# Patient Record
Sex: Male | Born: 1965 | Race: Black or African American | Hispanic: No | Marital: Single | Smoking: Former smoker
Health system: Southern US, Community
[De-identification: ages and names within clinical notes are randomized; demographics above are authoritative.]

## PROBLEM LIST (undated history)

## (undated) DIAGNOSIS — R569 Unspecified convulsions: Secondary | ICD-10-CM

---

## 2017-03-01 ENCOUNTER — Emergency Department (HOSPITAL_COMMUNITY): Payer: TRICARE For Life (TFL)

## 2017-03-01 ENCOUNTER — Emergency Department (HOSPITAL_COMMUNITY)
Admission: EM | Admit: 2017-03-01 | Discharge: 2017-03-01 | Disposition: A | Discharge status: Home / Self Care | Payer: TRICARE For Life (TFL) | Attending: Emergency Medicine | Admitting: Emergency Medicine

## 2017-03-01 DIAGNOSIS — R569 Unspecified convulsions: Secondary | ICD-10-CM | POA: Insufficient documentation

## 2017-03-01 DIAGNOSIS — T50905A Adverse effect of unspecified drugs, medicaments and biological substances, initial encounter: Secondary | ICD-10-CM | POA: Diagnosis not present

## 2017-03-01 LAB — BASIC METABOLIC PANEL
ANION GAP: 8 (ref 5–15)
BUN: 6 mg/dL (ref 6–20)
CALCIUM: 9.3 mg/dL (ref 8.9–10.3)
CHLORIDE: 103 mmol/L (ref 101–111)
CO2: 26 mmol/L (ref 22–32)
CREATININE: 1.37 mg/dL — AB (ref 0.61–1.24)
GFR calc non Af Amer: 59 mL/min — ABNORMAL LOW (ref >60)
Glucose, Bld: 125 mg/dL — ABNORMAL HIGH (ref 65–99)
Potassium: 4.1 mmol/L (ref 3.5–5.1)
SODIUM: 137 mmol/L (ref 135–145)

## 2017-03-01 LAB — URINALYSIS, ROUTINE W REFLEX MICROSCOPIC
BILIRUBIN URINE: NEGATIVE
GLUCOSE, UA: NEGATIVE mg/dL
HGB URINE DIPSTICK: NEGATIVE
KETONES UR: NEGATIVE mg/dL
Leukocytes, UA: NEGATIVE
Nitrite: NEGATIVE
Protein, ur: NEGATIVE mg/dL
Specific Gravity, Urine: 1.012 (ref 1.005–1.030)
pH: 8 (ref 5.0–8.0)

## 2017-03-01 LAB — CBC
HCT: 42.9 % (ref 39.0–52.0)
HEMOGLOBIN: 14.8 g/dL (ref 13.0–17.0)
MCH: 30.2 pg (ref 26.0–34.0)
MCHC: 34.5 g/dL (ref 30.0–36.0)
MCV: 87.6 fL (ref 78.0–100.0)
PLATELETS: 157 10*3/uL (ref 150–400)
RBC: 4.9 MIL/uL (ref 4.22–5.81)
RDW: 12.6 % (ref 11.5–15.5)
WBC: 12 10*3/uL — AB (ref 4.0–10.5)

## 2017-03-01 LAB — HEPATIC FUNCTION PANEL
ALBUMIN: 3.9 g/dL (ref 3.5–5.0)
ALT: 37 U/L (ref 17–63)
AST: 29 U/L (ref 15–41)
Alkaline Phosphatase: 71 U/L (ref 38–126)
TOTAL PROTEIN: 6.7 g/dL (ref 6.5–8.1)
Total Bilirubin: 0.4 mg/dL (ref 0.3–1.2)

## 2017-03-01 LAB — I-STAT TROPONIN, ED: TROPONIN I, POC: 0 ng/mL (ref 0.00–0.08)

## 2017-03-01 LAB — I-STAT CG4 LACTIC ACID, ED
LACTIC ACID, VENOUS: 1.3 mmol/L (ref 0.5–1.9)
Lactic Acid, Venous: 3.38 mmol/L (ref 0.5–1.9)

## 2017-03-01 LAB — CBG MONITORING, ED: Glucose-Capillary: 118 mg/dL — ABNORMAL HIGH (ref 65–99)

## 2017-03-01 LAB — RAPID URINE DRUG SCREEN, HOSP PERFORMED
Amphetamines: NOT DETECTED
Barbiturates: NOT DETECTED
Benzodiazepines: NOT DETECTED
Cocaine: NOT DETECTED
Opiates: NOT DETECTED
Tetrahydrocannabinol: POSITIVE — AB

## 2017-03-01 LAB — LIPASE, BLOOD: Lipase: 19 U/L (ref 11–51)

## 2017-03-01 LAB — ETHANOL

## 2017-03-01 MED ORDER — THIAMINE HCL 100 MG/ML IJ SOLN
Freq: Once | INTRAVENOUS | Status: AC
  Administered 2017-03-01: 14:00:00 via INTRAVENOUS
  Filled 2017-03-01: qty 1000

## 2017-03-01 NOTE — ED Notes (Signed)
Pt getting dressed.

## 2017-03-01 NOTE — ED Triage Notes (Signed)
Per EMS pt comes from home with new onset seizure that last for 3-5 minutes.  Pt is on Lamictal and EMS stated that his wife said his dose was recently changed.  Pt has history of PTSD.  After seizure pt was Altered and did vomit and received 4mg  of Zofran in route to [facility].  NAD noted at this time PT is disoriented to time.  Alert to self and situation.

## 2017-03-01 NOTE — Discharge Instructions (Signed)
1.  At this time it is most likely that your seizure was a complication of your Buproprion (medication you are taking for depression and anxiety symptoms).  Seizures are a known side effect that affect some people on this medication.  Do not take any more of this medication.  You must discuss any other alternative treatment you may need with your psychiatrist. 2.  The alcohol withdrawal can also cause seizures.  At this time however, you do not show signs of active withdrawal.  People withdrawal usually have significant anxiety or agitation, tremor, confusion.  You do not have any of the symptoms at this time.  Do not drink any additional alcohol now that you have quit. 3.  Since you have had a seizure, you may not drive a motor vehicle or do any dangerous activities until you have been seen in follow-up by a neurologist.  Not do anything such as climbing on a ladder, swimming or any activity that could result in injury to yourself or others if you had a seizure. 4.  [facility] provider in the morning and schedule a appointment with the neurology service as soon as possible.

## 2017-03-01 NOTE — ED Provider Notes (Addendum)
[facility] EMERGENCY DEPARTMENT Provider Note   CSN: [id number] Arrival date & time: 03/01/17  1154     History   Chief Complaint Chief Complaint  Patient presents with  . Seizures    HPI Hunter Green is a 52 y.o. male.  HPI Patient has no prior seizure history.  His wife reports they were just laughing and talking well making some food in the kitchen.  She had turned away and heard him fall to the floor.  She reports he was having seizure activity.  She describes tonic-clonic activity for about 2 minutes.  She called EMS and he started to come back to normal.  Patient reports he did not have any preceding symptoms.  He reports now he just feels kind of washed out and fatigued.  He is felt fine all week.  He however has recently quit drinking alcohol.  He estimates about a week and a half ago.  His wife reports she has noted him to be a little more on edge or agitated than usual but he is not been having any problems with confusion, hallucination or tremor.  Previously he drank about half case of beer a day.  He reports he knew he was drinking too much so it was just time to stop.  He has never had alcohol withdrawal seizure before.  Patient also takes Wellbutrin as a routine medication for depression\PTSD. No past medical history on file.  There are no active problems to display for this patient.        Home Medications    Prior to Admission medications   Not on File    Family History No family history on file.  Social History Social History   Tobacco Use  . Smoking status: Not on file  Substance Use Topics  . Alcohol use: Not on file  . Drug use: Not on file     Allergies   Patient has no allergy information on record.   Review of Systems Review of Systems 10 Systems reviewed and are negative for acute change except as noted in the HPI.   Physical Exam Updated Vital Signs BP (!) 126/91   Pulse 76   Temp 97.7 F (36.5 C) (Oral)    Resp 10   Ht 6\' 2"  (1.88 m)   Wt 95.3 kg (210 lb)   SpO2 100%   BMI 26.96 kg/m   Physical Exam  Constitutional: He is oriented to person, place, and time.  Patient is alert and clinically well in appearance.  He shows no signs of confusion. no Respiratory distress.  HENT:  Head: Normocephalic and atraumatic.  Nose: Nose normal.  Mouth/Throat: Oropharynx is clear and moist.  Eyes: EOM are normal. Pupils are equal, round, and reactive to light.  Neck: Neck supple.  Cardiovascular: Normal rate, regular rhythm and normal heart sounds.  Pulmonary/Chest: Effort normal and breath sounds normal.  Abdominal: Soft. He exhibits no distension. There is no tenderness.  Musculoskeletal: Normal range of motion. He exhibits no edema, tenderness or deformity.  Neurological: He is alert and oriented to person, place, and time. No cranial nerve deficit or sensory deficit. He exhibits normal muscle tone. Coordination normal.  Patient is alert and appropriate.  He shows no signs of confusion.  Patient has no tremor.  Movements are coordinated purposeful symmetric.  Skin: Skin is warm and dry.  Psychiatric: He has a normal mood and affect.     ED Treatments / Results  Labs (all labs ordered  are listed, but only abnormal results are displayed) Labs Reviewed  BASIC METABOLIC PANEL - Abnormal; Notable for the following components:      Result Value   Glucose, Bld 125 (*)    Creatinine, Ser 1.37 (*)    GFR calc non Af Amer 59 (*)    All other components within normal limits  CBC - Abnormal; Notable for the following components:   WBC 12.0 (*)    All other components within normal limits  HEPATIC FUNCTION PANEL - Abnormal; Notable for the following components:   Bilirubin, Direct <0.1 (*)    All other components within normal limits  CBG MONITORING, ED - Abnormal; Notable for the following components:   Glucose-Capillary 118 (*)    All other components within normal limits  I-STAT CG4 LACTIC ACID,  ED - Abnormal; Notable for the following components:   Lactic Acid, Venous 3.38 (*)    All other components within normal limits  ETHANOL  LIPASE, BLOOD  URINALYSIS, ROUTINE W REFLEX MICROSCOPIC  RAPID URINE DRUG SCREEN, HOSP PERFORMED  I-STAT TROPONIN, ED  I-STAT CG4 LACTIC ACID, ED    EKG  EKG Interpretation  Date/Time:  Thursday March 01 2017 11:54:35 EST Ventricular Rate:  105 PR Interval:    QRS Duration: 112 QT Interval:  360 QTC Calculation: 476 R Axis:   72 Text Interpretation:  Sinus tachycardia Borderline prolonged PR interval Incomplete right bundle branch block Borderline prolonged QT interval agree. no STEMI. Confirmed by Arby BarrettePfeiffer, Ajdin Macke [phone]([id number]) on 03/01/2017 3:41:54 PM       Radiology Ct Head Wo Contrast  Result Date: 03/01/2017 CLINICAL DATA:  52 y/o  M; new onset seizure. EXAM: CT HEAD WITHOUT CONTRAST CT CERVICAL SPINE WITHOUT CONTRAST TECHNIQUE: Multidetector CT imaging of the head and cervical spine was performed following the standard protocol without intravenous contrast. Multiplanar CT image reconstructions of the cervical spine were also generated. COMPARISON:  None. FINDINGS: CT HEAD FINDINGS Brain: No evidence of acute infarction, hemorrhage, hydrocephalus, extra-axial collection or mass lesion/mass effect. Vascular: No hyperdense vessel or unexpected calcification. 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 No calvarial fracture. Sinuses/Orbits: Small right maxillary and left anterior ethmoid sinus mucous retention cyst. Otherwise negative. Other: None. CT CERVICAL SPINE FINDINGS Alignment: Mild reversal of cervical curvature.  No listhesis. Skull base and vertebrae: No acute fracture. No primary bone lesion or focal pathologic process. Soft tissues and spinal canal: No prevertebral fluid or swelling. No visible canal hematoma. Disc levels: Mild cervical spondylosis greatest at the C3-4 level or  uncovertebral and facet hypertrophy encroaches on the neural foramen and there is mild canal stenosis. Upper chest: Negative. Other: Negative. IMPRESSION: 1. Negative CT of the head. 2. Negative CT of the cervical spine. 3. Mild cervical spondylosis greatest at C3-4 level. Electronically Signed   By: Mitzi HansenLance  Furusawa-Stratton M.D.   On: 03/01/2017 14:21   Ct Cervical Spine Wo Contrast  Result Date: 03/01/2017 CLINICAL DATA:  52 y/o  M; new onset seizure. EXAM: CT HEAD WITHOUT CONTRAST CT CERVICAL SPINE WITHOUT CONTRAST TECHNIQUE: Multidetector CT imaging of the head and cervical spine was performed following the standard protocol without intravenous contrast. Multiplanar CT image reconstructions of the cervical spine were also generated. COMPARISON:  None. FINDINGS: CT HEAD FINDINGS Brain: No evidence of acute infarction, hemorrhage, hydrocephalus, extra-axial collection or mass lesion/mass effect. Vascular: No hyperdense vessel or unexpected calcification. Skull: Left zygoma cortical focus of ground-glass density without mass effect, likely fibrous  dysplasia. No calvarial fracture. Sinuses/Orbits: Small right maxillary and left anterior ethmoid sinus mucous retention cyst. Otherwise negative. Other: None. CT CERVICAL SPINE FINDINGS Alignment: Mild reversal of cervical curvature.  No listhesis. Skull base and vertebrae: No acute fracture. No primary bone lesion or focal pathologic process. Soft tissues and spinal canal: No prevertebral fluid or swelling. No visible canal hematoma. Disc levels: Mild cervical spondylosis greatest at the C3-4 level or uncovertebral and facet hypertrophy encroaches on the neural foramen and there is mild canal stenosis. Upper chest: Negative. Other: Negative. IMPRESSION: 1. Negative CT of the head. 2. Negative CT of the cervical spine. 3. Mild cervical spondylosis greatest at C3-4 level. Electronically Signed   By: Mitzi Hansen M.D.   On: 03/01/2017 14:21     Procedures Procedures (including critical care time)  Medications Ordered in ED Medications  sodium chloride 0.9 % 1,000 mL with thiamine 100 mg, folic acid 1 mg, multivitamins adult 10 mL infusion ( Intravenous New Bag/Given 03/01/17 1404)     Initial Impression / Assessment and Plan / ED Course  I have reviewed the triage vital signs and the nursing notes.  Pertinent labs & imaging results that were available during my care of the patient were reviewed by me and considered in my medical decision making (see chart for details).     Final Clinical Impressions(s) / ED Diagnoses   Final diagnoses:  New onset seizure (HCC)  Adverse effect of drug, initial encounter  Diagnostic evaluation is normal.  CT head and neck without abnormality.  Labs without abnormality.  At this time, I suspect bupropion as the etiology of patient's new onset seizure.  This was one isolated seizure lasting approximately 2 minutes per his wife.  Patient also quit drinking about 9 days ago.  Objectively however he shows no signs of alcohol withdrawal.  His vital signs are stable without tachycardia or hypertension.  His mental status is clear and he shows no signs of agitation, tremor or hallucination.  Highly doubt alcohol withdrawal as the actual etiology of the seizure.  Notably, the patient did not admit to drug use during HPI but he did state to the nurse who collected a specimen, regarding the drug screen "It's going to come back hot".  Patient is in good clinical condition and I feel stable for discharge at this time.  He is counseled that he may not drive or do any activities that could result in injury until he has had follow-up with neurology and is cleared to do so again.  He is also aware that he can no longer take any Wellbutrin and to avoid all drugs and alcohol.  ED Discharge Orders    None       Arby Barrette, MD 03/01/17 1635    Arby Barrette, MD 03/01/17 [phone]

## 2018-07-20 ENCOUNTER — Encounter: Payer: Self-pay | Admitting: Emergency Medicine

## 2018-07-20 ENCOUNTER — Emergency Department
Admission: EM | Admit: 2018-07-20 | Discharge: 2018-07-20 | Disposition: A | Discharge status: Home / Self Care | Attending: Emergency Medicine | Admitting: Emergency Medicine

## 2018-07-20 ENCOUNTER — Emergency Department

## 2018-07-20 ENCOUNTER — Other Ambulatory Visit: Payer: Self-pay

## 2018-07-20 DIAGNOSIS — F17210 Nicotine dependence, cigarettes, uncomplicated: Secondary | ICD-10-CM | POA: Insufficient documentation

## 2018-07-20 DIAGNOSIS — M7989 Other specified soft tissue disorders: Secondary | ICD-10-CM

## 2018-07-20 DIAGNOSIS — R2241 Localized swelling, mass and lump, right lower limb: Secondary | ICD-10-CM | POA: Diagnosis present

## 2018-07-20 DIAGNOSIS — M79661 Pain in right lower leg: Secondary | ICD-10-CM | POA: Insufficient documentation

## 2018-07-20 DIAGNOSIS — F1210 Cannabis abuse, uncomplicated: Secondary | ICD-10-CM | POA: Diagnosis not present

## 2018-07-20 DIAGNOSIS — M79604 Pain in right leg: Secondary | ICD-10-CM

## 2018-07-20 LAB — CBC WITH DIFFERENTIAL/PLATELET
Abs Immature Granulocytes: 0.03 10*3/uL (ref 0.00–0.07)
Basophils Absolute: 0 10*3/uL (ref 0.0–0.1)
Basophils Relative: 0 %
Eosinophils Absolute: 0 10*3/uL (ref 0.0–0.5)
Eosinophils Relative: 1 %
HCT: 45.2 % (ref 39.0–52.0)
Hemoglobin: 15.6 g/dL (ref 13.0–17.0)
Immature Granulocytes: 0 %
Lymphocytes Relative: 28 %
Lymphs Abs: 2.1 10*3/uL (ref 0.7–4.0)
MCH: 30.1 pg (ref 26.0–34.0)
MCHC: 34.5 g/dL (ref 30.0–36.0)
MCV: 87.3 fL (ref 80.0–100.0)
Monocytes Absolute: 0.6 10*3/uL (ref 0.1–1.0)
Monocytes Relative: 8 %
Neutro Abs: 4.6 10*3/uL (ref 1.7–7.7)
Neutrophils Relative %: 63 %
Platelets: 174 10*3/uL (ref 150–400)
RBC: 5.18 MIL/uL (ref 4.22–5.81)
RDW: 11.9 % (ref 11.5–15.5)
WBC: 7.4 10*3/uL (ref 4.0–10.5)
nRBC: 0 % (ref 0.0–0.2)

## 2018-07-20 LAB — COMPREHENSIVE METABOLIC PANEL
ALT: 33 U/L (ref 0–44)
AST: 25 U/L (ref 15–41)
Albumin: 4.6 g/dL (ref 3.5–5.0)
Alkaline Phosphatase: 84 U/L (ref 38–126)
Anion gap: 9 (ref 5–15)
BUN: 13 mg/dL (ref 6–20)
CO2: 27 mmol/L (ref 22–32)
Calcium: 9.4 mg/dL (ref 8.9–10.3)
Chloride: 101 mmol/L (ref 98–111)
Creatinine, Ser: 1.12 mg/dL (ref 0.61–1.24)
GFR calc Af Amer: >60 mL/min (ref >60)
GFR calc non Af Amer: >60 mL/min (ref >60)
Glucose, Bld: 92 mg/dL (ref 70–99)
Potassium: 4.1 mmol/L (ref 3.5–5.1)
Sodium: 137 mmol/L (ref 135–145)
Total Bilirubin: 0.5 mg/dL (ref 0.3–1.2)
Total Protein: 7.6 g/dL (ref 6.5–8.1)

## 2018-07-20 LAB — PROTIME-INR
INR: 0.9 (ref 0.8–1.2)
Prothrombin Time: 12.5 seconds (ref 11.4–15.2)

## 2018-07-20 NOTE — ED Provider Notes (Signed)
[facility] Emergency Department Provider Note  ____________________________________________  Time seen: Approximately 6:40 PM  I have reviewed the triage vital signs and the nursing notes.   HISTORY  Chief Complaint No chief complaint on file.    HPI Hunter Green is a 53 y.o. male presents to the emergency department with right calf and ankle swelling and unexplained ecchymosis along the perimeter of the right foot.  Patient states that he is a daily smoker.  He states that he hit his shin against a wooden post approximately 7 to 10 days ago and had a small local hematoma but did not have any right calf or ankle swelling in the days after the injury occurred.  He denies history of prior DVT.  He is a daily smoker.  He denies recent falls, traumas or inversion type injuries.  Patient states that he has some calf pain with ambulation but denies pain. No other alleviating measures have been attempted.         History reviewed. No pertinent past medical history.  There are no active problems to display for this patient.   History reviewed. No pertinent surgical history.  Prior to Admission medications   Not on File    Allergies Patient has no known allergies.  History reviewed. No pertinent family history.  Social History Social History   Tobacco Use  . Smoking status: Former Games developer  . Smokeless tobacco: Never Used  Substance Use Topics  . Alcohol use: Yes  . Drug use: Yes    Types: Marijuana     Review of Systems  Constitutional: No fever/chills Eyes: No visual changes. No discharge ENT: No upper respiratory complaints. Cardiovascular: no chest pain. Respiratory: no cough. No SOB. Gastrointestinal: No abdominal pain.  No nausea, no vomiting.  No diarrhea.  No constipation. Musculoskeletal: Patient has right calf swelling and ankle pain.  Skin: Negative for rash, abrasions, lacerations, ecchymosis. Neurological: Negative for headaches,  focal weakness or numbness.   ____________________________________________   PHYSICAL EXAM:  VITAL SIGNS: ED Triage Vitals  Enc Vitals Group     BP 07/20/18 1609 (!) 121/106     Pulse Rate 07/20/18 1609 91     Resp 07/20/18 1609 17     Temp 07/20/18 1609 98.2 F (36.8 C)     Temp Source 07/20/18 1609 Oral     SpO2 07/20/18 1609 98 %     Weight 07/20/18 1609 215 lb (97.5 kg)     Height 07/20/18 1609 6\' 1"  (1.854 m)     Head Circumference --      Peak Flow --      Pain Score 07/20/18 1618 1     Pain Loc --      Pain Edu? --      Excl. in GC? --      Constitutional: Alert and oriented. Well appearing and in no acute distress. Eyes: Conjunctivae are normal. PERRL. EOMI. Head: Atraumatic. Cardiovascular: Normal rate, regular rhythm. Normal S1 and S2.  Good peripheral circulation. Respiratory: Normal respiratory effort without tachypnea or retractions. Lungs CTAB. Good air entry to the bases with no decreased or absent breath sounds. Musculoskeletal: Full range of motion to all extremities. No gross deformities appreciated.  Patient has small hematoma at right anterior shin.  He has 2+ pitting edema at right ankle with ecchymosis along medial aspect of right ankle.  Palpable dorsalis pedis pulse bilaterally and symmetrically.  Right lower extremity is warm to the touch. Neurologic:  Normal speech and  language. No gross focal neurologic deficits are appreciated.  Skin:  Skin is warm, dry and intact. No rash noted. Psychiatric: Mood and affect are normal. Speech and behavior are normal. Patient exhibits appropriate insight and judgement.   ____________________________________________   LABS (all labs ordered are listed, but only abnormal results are displayed)  Labs Reviewed  CBC WITH DIFFERENTIAL/PLATELET  COMPREHENSIVE METABOLIC PANEL  PROTIME-INR   ____________________________________________  EKG   ____________________________________________  RADIOLOGY I  personally viewed and evaluated these images as part of my medical decision making, as well as reviewing the written report by the radiologist.    [place] Venous Img Lower Unilateral Right  Result Date: 07/20/2018 CLINICAL DATA:  Right calf swelling EXAM: Right LOWER EXTREMITY VENOUS DOPPLER ULTRASOUND TECHNIQUE: Gray-scale sonography with graded compression, as well as color Doppler and duplex ultrasound were performed to evaluate the lower extremity deep venous systems from the level of the common femoral vein and including the common femoral, femoral, profunda femoral, popliteal and calf veins including the posterior tibial, peroneal and gastrocnemius veins when visible. The superficial great saphenous vein was also interrogated. Spectral Doppler was utilized to evaluate flow at rest and with distal augmentation maneuvers in the common femoral, femoral and popliteal veins. COMPARISON:  None. FINDINGS: Contralateral Common Femoral Vein: Respiratory phasicity is normal and symmetric with the symptomatic side. No evidence of thrombus. Normal compressibility. Common Femoral Vein: No evidence of thrombus. Normal compressibility, respiratory phasicity and response to augmentation. Saphenofemoral Junction: No evidence of thrombus. Normal compressibility and flow on color Doppler imaging. Profunda Femoral Vein: No evidence of thrombus. Normal compressibility and flow on color Doppler imaging. Femoral Vein: No evidence of thrombus. Normal compressibility, respiratory phasicity and response to augmentation. Popliteal Vein: No evidence of thrombus. Normal compressibility, respiratory phasicity and response to augmentation. Calf Veins: No evidence of thrombus. Normal compressibility and flow on color Doppler imaging. Superficial Great Saphenous Vein: No evidence of thrombus. Normal compressibility. Venous Reflux:  None. Other Findings:  None. IMPRESSION: No evidence of deep venous thrombosis. Electronically Signed   By:  Katherine Mantlehristopher  Green M.D.   On: 07/20/2018 19:50    ____________________________________________    PROCEDURES  Procedure(s) performed:    Procedures    Medications - No data to display   ____________________________________________   INITIAL IMPRESSION / ASSESSMENT AND PLAN / ED COURSE  Pertinent labs & imaging results that were available during my care of the patient were reviewed by me and considered in my medical decision making (see chart for details).  Review of the [place] CSRS was performed in accordance of the [facility] prior to dispensing any controlled drugs.           Assessment and Plan:  Right lower extremity pain:   53 year old male presents to the emergency department with right calf pain and swelling with unexplained ecchymosis along the medial aspect of the right foot.    Differential diagnosis included DVT, leg contusion, thrombocytopenia..  Basic labs conducted in the emergency department were reassuring.  Venous ultrasound was noncontributory for acute thromboembolism.  PT and INR were within reference range.  Tylenol was recommended for discomfort.  Patient was advised to follow-up with primary care as needed. All patient questions were answered.   ____________________________________________  FINAL CLINICAL IMPRESSION(S) / ED DIAGNOSES  Final diagnoses:  Right leg swelling  Right leg pain      NEW MEDICATIONS STARTED DURING THIS VISIT:  ED Discharge Orders    None  This chart was dictated using voice recognition software/Dragon. Despite best efforts to proofread, errors can occur which can change the meaning. Any change was purely unintentional.    Gasper Lloyd 07/20/18 2049    Phineas Semen, MD 07/20/18 2204

## 2018-07-20 NOTE — ED Notes (Signed)
States hit right shin on fence post last week.  Swelling and scabbed area noted to upper shin.  Patient states that this morning in the shower he noticed discoloration distally to initial wound and dark maroon area just below ankle medially.  + DP and PT equal and strong bilaterally.  + CMS

## 2018-07-20 NOTE — Discharge Instructions (Signed)
Take Tylenol as needed for pain.

## 2018-07-20 NOTE — ED Triage Notes (Signed)
Pt to ED with c/o of bruising to right ankle. Pt states injury leg last week but bruising to ankle is stated today.

## 2018-09-12 ENCOUNTER — Inpatient Hospital Stay
Admission: EM | Admit: 2018-09-12 | Discharge: 2018-09-15 | DRG: 101 | Disposition: A | Discharge status: Home / Self Care | Attending: Internal Medicine | Admitting: Internal Medicine

## 2018-09-12 DIAGNOSIS — E872 Acidosis: Secondary | ICD-10-CM | POA: Diagnosis present

## 2018-09-12 DIAGNOSIS — F4310 Post-traumatic stress disorder, unspecified: Secondary | ICD-10-CM | POA: Diagnosis not present

## 2018-09-12 DIAGNOSIS — R609 Edema, unspecified: Secondary | ICD-10-CM

## 2018-09-12 DIAGNOSIS — F315 Bipolar disorder, current episode depressed, severe, with psychotic features: Secondary | ICD-10-CM | POA: Diagnosis present

## 2018-09-12 DIAGNOSIS — Z87891 Personal history of nicotine dependence: Secondary | ICD-10-CM | POA: Diagnosis not present

## 2018-09-12 DIAGNOSIS — F1210 Cannabis abuse, uncomplicated: Secondary | ICD-10-CM | POA: Diagnosis present

## 2018-09-12 DIAGNOSIS — Z79899 Other long term (current) drug therapy: Secondary | ICD-10-CM | POA: Diagnosis not present

## 2018-09-12 DIAGNOSIS — F419 Anxiety disorder, unspecified: Secondary | ICD-10-CM | POA: Diagnosis present

## 2018-09-12 DIAGNOSIS — R4182 Altered mental status, unspecified: Secondary | ICD-10-CM | POA: Diagnosis present

## 2018-09-12 DIAGNOSIS — Z9114 Patient's other noncompliance with medication regimen: Secondary | ICD-10-CM | POA: Diagnosis not present

## 2018-09-12 DIAGNOSIS — R739 Hyperglycemia, unspecified: Secondary | ICD-10-CM | POA: Diagnosis present

## 2018-09-12 DIAGNOSIS — G4700 Insomnia, unspecified: Secondary | ICD-10-CM | POA: Diagnosis not present

## 2018-09-12 DIAGNOSIS — F515 Nightmare disorder: Secondary | ICD-10-CM | POA: Diagnosis present

## 2018-09-12 DIAGNOSIS — G40409 Other generalized epilepsy and epileptic syndromes, not intractable, without status epilepticus: Principal | ICD-10-CM | POA: Diagnosis present

## 2018-09-12 DIAGNOSIS — R52 Pain, unspecified: Secondary | ICD-10-CM

## 2018-09-12 DIAGNOSIS — R569 Unspecified convulsions: Secondary | ICD-10-CM

## 2018-09-12 DIAGNOSIS — E876 Hypokalemia: Secondary | ICD-10-CM | POA: Diagnosis present

## 2018-09-12 DIAGNOSIS — N179 Acute kidney failure, unspecified: Secondary | ICD-10-CM | POA: Diagnosis not present

## 2018-09-12 DIAGNOSIS — Z20828 Contact with and (suspected) exposure to other viral communicable diseases: Secondary | ICD-10-CM | POA: Diagnosis not present

## 2018-09-12 DIAGNOSIS — F23 Brief psychotic disorder: Secondary | ICD-10-CM

## 2018-09-12 HISTORY — DX: Unspecified convulsions: R56.9

## 2018-09-12 MED ORDER — LORAZEPAM 2 MG/ML IJ SOLN
2.0000 mg | Freq: Once | INTRAMUSCULAR | Status: AC
  Administered 2018-09-13: 2 mg via INTRAVENOUS

## 2018-09-12 MED ORDER — DIPHENHYDRAMINE HCL 50 MG/ML IJ SOLN: 50.0000 mg | Freq: Once | INTRAMUSCULAR | Status: DC

## 2018-09-12 MED ORDER — LORAZEPAM 2 MG/ML IJ SOLN
INTRAMUSCULAR | Status: AC
  Administered 2018-09-13: 2 mg via INTRAVENOUS
  Filled 2018-09-12: qty 1

## 2018-09-12 MED ORDER — HALOPERIDOL LACTATE 5 MG/ML IJ SOLN: 2.0000 mg | Freq: Once | INTRAMUSCULAR | Status: DC

## 2018-09-13 ENCOUNTER — Other Ambulatory Visit: Payer: Self-pay

## 2018-09-13 ENCOUNTER — Inpatient Hospital Stay

## 2018-09-13 ENCOUNTER — Encounter: Payer: Self-pay | Admitting: Emergency Medicine

## 2018-09-13 DIAGNOSIS — R739 Hyperglycemia, unspecified: Secondary | ICD-10-CM | POA: Diagnosis present

## 2018-09-13 DIAGNOSIS — F315 Bipolar disorder, current episode depressed, severe, with psychotic features: Secondary | ICD-10-CM | POA: Diagnosis present

## 2018-09-13 DIAGNOSIS — F4310 Post-traumatic stress disorder, unspecified: Secondary | ICD-10-CM

## 2018-09-13 DIAGNOSIS — Z79899 Other long term (current) drug therapy: Secondary | ICD-10-CM | POA: Diagnosis not present

## 2018-09-13 DIAGNOSIS — G4700 Insomnia, unspecified: Secondary | ICD-10-CM | POA: Diagnosis present

## 2018-09-13 DIAGNOSIS — F1210 Cannabis abuse, uncomplicated: Secondary | ICD-10-CM | POA: Diagnosis present

## 2018-09-13 DIAGNOSIS — R569 Unspecified convulsions: Secondary | ICD-10-CM

## 2018-09-13 DIAGNOSIS — F515 Nightmare disorder: Secondary | ICD-10-CM | POA: Diagnosis present

## 2018-09-13 DIAGNOSIS — Z20828 Contact with and (suspected) exposure to other viral communicable diseases: Secondary | ICD-10-CM | POA: Diagnosis present

## 2018-09-13 DIAGNOSIS — F419 Anxiety disorder, unspecified: Secondary | ICD-10-CM | POA: Diagnosis present

## 2018-09-13 DIAGNOSIS — G40409 Other generalized epilepsy and epileptic syndromes, not intractable, without status epilepticus: Secondary | ICD-10-CM | POA: Diagnosis present

## 2018-09-13 DIAGNOSIS — E876 Hypokalemia: Secondary | ICD-10-CM | POA: Diagnosis present

## 2018-09-13 DIAGNOSIS — R4182 Altered mental status, unspecified: Secondary | ICD-10-CM | POA: Diagnosis present

## 2018-09-13 DIAGNOSIS — Z9114 Patient's other noncompliance with medication regimen: Secondary | ICD-10-CM | POA: Diagnosis not present

## 2018-09-13 DIAGNOSIS — Z87891 Personal history of nicotine dependence: Secondary | ICD-10-CM | POA: Diagnosis not present

## 2018-09-13 DIAGNOSIS — E872 Acidosis: Secondary | ICD-10-CM | POA: Diagnosis present

## 2018-09-13 DIAGNOSIS — N179 Acute kidney failure, unspecified: Secondary | ICD-10-CM | POA: Diagnosis present

## 2018-09-13 LAB — COMPREHENSIVE METABOLIC PANEL
ALT: 34 U/L (ref 0–44)
AST: 33 U/L (ref 15–41)
Albumin: 5.3 g/dL — ABNORMAL HIGH (ref 3.5–5.0)
Alkaline Phosphatase: 98 U/L (ref 38–126)
Anion gap: 27 — ABNORMAL HIGH (ref 5–15)
BUN: 15 mg/dL (ref 6–20)
CO2: 14 mmol/L — ABNORMAL LOW (ref 22–32)
Calcium: 10.1 mg/dL (ref 8.9–10.3)
Chloride: 102 mmol/L (ref 98–111)
Creatinine, Ser: 1.63 mg/dL — ABNORMAL HIGH (ref 0.61–1.24)
GFR calc Af Amer: 55 mL/min — ABNORMAL LOW (ref >60)
GFR calc non Af Amer: 48 mL/min — ABNORMAL LOW (ref >60)
Glucose, Bld: 210 mg/dL — ABNORMAL HIGH (ref 70–99)
Potassium: 3 mmol/L — ABNORMAL LOW (ref 3.5–5.1)
Sodium: 143 mmol/L (ref 135–145)
Total Bilirubin: 0.6 mg/dL (ref 0.3–1.2)
Total Protein: 8.7 g/dL — ABNORMAL HIGH (ref 6.5–8.1)

## 2018-09-13 LAB — CBC WITH DIFFERENTIAL/PLATELET
Abs Immature Granulocytes: 0.05 10*3/uL (ref 0.00–0.07)
Basophils Absolute: 0 10*3/uL (ref 0.0–0.1)
Basophils Relative: 0 %
Eosinophils Absolute: 0 10*3/uL (ref 0.0–0.5)
Eosinophils Relative: 0 %
HCT: 41.4 % (ref 39.0–52.0)
Hemoglobin: 14.3 g/dL (ref 13.0–17.0)
Immature Granulocytes: 1 %
Lymphocytes Relative: 13 %
Lymphs Abs: 1.3 10*3/uL (ref 0.7–4.0)
MCH: 29.4 pg (ref 26.0–34.0)
MCHC: 34.5 g/dL (ref 30.0–36.0)
MCV: 85 fL (ref 80.0–100.0)
Monocytes Absolute: 0.8 10*3/uL (ref 0.1–1.0)
Monocytes Relative: 8 %
Neutro Abs: 7.8 10*3/uL — ABNORMAL HIGH (ref 1.7–7.7)
Neutrophils Relative %: 78 %
Platelets: 155 10*3/uL (ref 150–400)
RBC: 4.87 MIL/uL (ref 4.22–5.81)
RDW: 12.4 % (ref 11.5–15.5)
WBC: 9.9 10*3/uL (ref 4.0–10.5)
nRBC: 0 % (ref 0.0–0.2)

## 2018-09-13 LAB — BASIC METABOLIC PANEL
Anion gap: 9 (ref 5–15)
Anion gap: 10 (ref 5–15)
BUN: 16 mg/dL (ref 6–20)
BUN: 14 mg/dL (ref 6–20)
CO2: 23 mmol/L (ref 22–32)
CO2: 24 mmol/L (ref 22–32)
Calcium: 9.1 mg/dL (ref 8.9–10.3)
Calcium: 9.2 mg/dL (ref 8.9–10.3)
Chloride: 106 mmol/L (ref 98–111)
Chloride: 107 mmol/L (ref 98–111)
Creatinine, Ser: 1.27 mg/dL — ABNORMAL HIGH (ref 0.61–1.24)
Creatinine, Ser: 1.21 mg/dL (ref 0.61–1.24)
GFR calc Af Amer: >60 mL/min (ref >60)
GFR calc Af Amer: >60 mL/min (ref >60)
GFR calc non Af Amer: >60 mL/min (ref >60)
GFR calc non Af Amer: >60 mL/min (ref >60)
Glucose, Bld: 111 mg/dL — ABNORMAL HIGH (ref 70–99)
Glucose, Bld: 98 mg/dL (ref 70–99)
Potassium: 3.7 mmol/L (ref 3.5–5.1)
Potassium: 4 mmol/L (ref 3.5–5.1)
Sodium: 138 mmol/L (ref 135–145)
Sodium: 141 mmol/L (ref 135–145)

## 2018-09-13 LAB — URINE DRUG SCREEN, QUALITATIVE (ARMC ONLY)
Amphetamines, Ur Screen: NOT DETECTED
Barbiturates, Ur Screen: NOT DETECTED
Benzodiazepine, Ur Scrn: POSITIVE — AB
Cannabinoid 50 Ng, Ur ~~LOC~~: POSITIVE — AB
Cocaine Metabolite,Ur ~~LOC~~: NOT DETECTED
MDMA (Ecstasy)Ur Screen: NOT DETECTED
Methadone Scn, Ur: NOT DETECTED
Opiate, Ur Screen: NOT DETECTED
Phencyclidine (PCP) Ur S: NOT DETECTED
Tricyclic, Ur Screen: NOT DETECTED

## 2018-09-13 LAB — CBC
HCT: 48 % (ref 39.0–52.0)
Hemoglobin: 15.9 g/dL (ref 13.0–17.0)
MCH: 29.8 pg (ref 26.0–34.0)
MCHC: 33.1 g/dL (ref 30.0–36.0)
MCV: 90.1 fL (ref 80.0–100.0)
Platelets: 205 10*3/uL (ref 150–400)
RBC: 5.33 MIL/uL (ref 4.22–5.81)
RDW: 12.5 % (ref 11.5–15.5)
WBC: 16.9 10*3/uL — ABNORMAL HIGH (ref 4.0–10.5)
nRBC: 0 % (ref 0.0–0.2)

## 2018-09-13 LAB — URINALYSIS, COMPLETE (UACMP) WITH MICROSCOPIC
Bacteria, UA: NONE SEEN
Bilirubin Urine: NEGATIVE
Glucose, UA: NEGATIVE mg/dL
Hgb urine dipstick: NEGATIVE
Ketones, ur: NEGATIVE mg/dL
Leukocytes,Ua: NEGATIVE
Nitrite: NEGATIVE
Protein, ur: NEGATIVE mg/dL
Specific Gravity, Urine: 1.012 (ref 1.005–1.030)
Squamous Epithelial / HPF: NONE SEEN (ref 0–5)
pH: 6 (ref 5.0–8.0)

## 2018-09-13 LAB — GLUCOSE, CAPILLARY
Glucose-Capillary: 86 mg/dL (ref 70–99)
Glucose-Capillary: 81 mg/dL (ref 70–99)
Glucose-Capillary: 91 mg/dL (ref 70–99)
Glucose-Capillary: 107 mg/dL — ABNORMAL HIGH (ref 70–99)

## 2018-09-13 LAB — ACETAMINOPHEN LEVEL: Acetaminophen (Tylenol), Serum: <10 ug/mL — ABNORMAL LOW (ref 10–30)

## 2018-09-13 LAB — SARS CORONAVIRUS 2 BY RT PCR (HOSPITAL ORDER, PERFORMED IN ~~LOC~~ HOSPITAL LAB): SARS Coronavirus 2: NEGATIVE

## 2018-09-13 LAB — HEMOGLOBIN A1C
Hgb A1c MFr Bld: 5.6 % (ref 4.8–5.6)
Mean Plasma Glucose: 114.02 mg/dL

## 2018-09-13 LAB — MAGNESIUM: Magnesium: 2.2 mg/dL (ref 1.7–2.4)

## 2018-09-13 LAB — ETHANOL: Alcohol, Ethyl (B): <10 mg/dL (ref <10)

## 2018-09-13 LAB — SALICYLATE LEVEL: Salicylate Lvl: <7 mg/dL (ref 2.8–30.0)

## 2018-09-13 MED ORDER — FLUOXETINE HCL 20 MG PO CAPS
60.0000 mg | ORAL_CAPSULE | Freq: Every day | ORAL | Status: DC
  Administered 2018-09-13 – 2018-09-14 (×2): 60 mg via ORAL
  Filled 2018-09-13 (×3): qty 3

## 2018-09-13 MED ORDER — SODIUM CHLORIDE 0.9% FLUSH
3.0000 mL | Freq: Two times a day (BID) | INTRAVENOUS | Status: DC
  Administered 2018-09-13 – 2018-09-14 (×2): 3 mL via INTRAVENOUS

## 2018-09-13 MED ORDER — ZIPRASIDONE MESYLATE 20 MG IM SOLR
10.0000 mg | Freq: Four times a day (QID) | INTRAMUSCULAR | Status: DC | PRN
  Filled 2018-09-13: qty 20

## 2018-09-13 MED ORDER — POTASSIUM CHLORIDE 20 MEQ PO PACK: 40.0000 meq | PACK | Freq: Once | ORAL | Status: DC

## 2018-09-13 MED ORDER — SODIUM CHLORIDE 0.9% FLUSH: 3.0000 mL | INTRAVENOUS | Status: DC | PRN

## 2018-09-13 MED ORDER — LORAZEPAM 2 MG/ML IJ SOLN: 1.0000 mg | INTRAMUSCULAR | Status: AC

## 2018-09-13 MED ORDER — ENOXAPARIN SODIUM 40 MG/0.4ML ~~LOC~~ SOLN
40.0000 mg | SUBCUTANEOUS | Status: DC
  Administered 2018-09-15: 06:00:00 40 mg via SUBCUTANEOUS
  Filled 2018-09-13 (×2): qty 0.4

## 2018-09-13 MED ORDER — LORAZEPAM 2 MG/ML IJ SOLN
2.0000 mg | Freq: Once | INTRAMUSCULAR | Status: AC
  Administered 2018-09-13: 2 mg via INTRAVENOUS

## 2018-09-13 MED ORDER — PRAZOSIN HCL 1 MG PO CAPS
1.0000 mg | ORAL_CAPSULE | Freq: Every day | ORAL | Status: DC
  Administered 2018-09-13 – 2018-09-14 (×2): 1 mg via ORAL
  Filled 2018-09-13 (×3): qty 1

## 2018-09-13 MED ORDER — ONDANSETRON HCL 4 MG PO TABS: 4.0000 mg | ORAL_TABLET | Freq: Four times a day (QID) | ORAL | Status: DC | PRN

## 2018-09-13 MED ORDER — INSULIN ASPART 100 UNIT/ML ~~LOC~~ SOLN: 0.0000 [IU] | Freq: Three times a day (TID) | SUBCUTANEOUS | Status: DC

## 2018-09-13 MED ORDER — LEVETIRACETAM 500 MG PO TABS
500.0000 mg | ORAL_TABLET | Freq: Two times a day (BID) | ORAL | Status: DC
  Filled 2018-09-13: qty 1

## 2018-09-13 MED ORDER — LEVETIRACETAM IN NACL 500 MG/100ML IV SOLN
500.0000 mg | Freq: Two times a day (BID) | INTRAVENOUS | Status: DC
  Administered 2018-09-13 – 2018-09-15 (×4): 500 mg via INTRAVENOUS
  Filled 2018-09-13 (×6): qty 100

## 2018-09-13 MED ORDER — ACETAMINOPHEN 325 MG PO TABS: 650.0000 mg | ORAL_TABLET | ORAL | Status: DC | PRN

## 2018-09-13 MED ORDER — MAGNESIUM HYDROXIDE 400 MG/5ML PO SUSP
30.0000 mL | Freq: Every day | ORAL | Status: DC | PRN
  Filled 2018-09-13: qty 30

## 2018-09-13 MED ORDER — LEVETIRACETAM IN NACL 1000 MG/100ML IV SOLN
1000.0000 mg | Freq: Once | INTRAVENOUS | Status: AC
  Administered 2018-09-13: 03:00:00 1000 mg via INTRAVENOUS
  Filled 2018-09-13: qty 100

## 2018-09-13 MED ORDER — ONDANSETRON HCL 4 MG/2ML IJ SOLN: 4.0000 mg | Freq: Four times a day (QID) | INTRAMUSCULAR | Status: DC | PRN

## 2018-09-13 MED ORDER — ACETAMINOPHEN 650 MG RE SUPP: 650.0000 mg | RECTAL | Status: DC | PRN

## 2018-09-13 MED ORDER — TRAZODONE HCL 50 MG PO TABS
100.0000 mg | ORAL_TABLET | Freq: Every day | ORAL | Status: DC
  Administered 2018-09-13 – 2018-09-14 (×2): 100 mg via ORAL
  Filled 2018-09-13 (×2): qty 2

## 2018-09-13 MED ORDER — IBUPROFEN 400 MG PO TABS
400.0000 mg | ORAL_TABLET | ORAL | Status: DC | PRN
  Administered 2018-09-13: 400 mg via ORAL
  Filled 2018-09-13: qty 1

## 2018-09-13 MED ORDER — SODIUM CHLORIDE 0.9 % IV SOLN
75.0000 mL/h | INTRAVENOUS | Status: DC
  Administered 2018-09-13 – 2018-09-15 (×4): 75 mL/h via INTRAVENOUS

## 2018-09-13 MED ORDER — SODIUM CHLORIDE 0.9 % IV SOLN
250.0000 mL | INTRAVENOUS | Status: DC
  Administered 2018-09-13: 06:00:00 250 mL via INTRAVENOUS

## 2018-09-13 NOTE — Procedures (Signed)
ELECTROENCEPHALOGRAM REPORT   Patient: Hunter Green       Room #: 124A-AA EEG No. ID: [id number] Age: 53 y.o.        Sex: male Referring Physician: Sudini Report Date:  09/13/2018        Interpreting Physician: Alexis Goodell  History: Safal Halderman is an 53 y.o. male with seizures  Medications:  Prozac, Haldol, Keppra, Minipress, Desyrel  Conditions of Recording:  This is a 21 channel routine scalp EEG performed with bipolar and monopolar montages arranged in accordance to the international 10/20 system of electrode placement. One channel was dedicated to EKG recording.  The patient is in the awake state.  Description:  The waking background activity consists of a low voltage, symmetrical, fairly well organized, 9-10 Hz alpha activity, seen from the parieto-occipital and posterior temporal regions.  Low voltage fast activity, poorly organized, is seen anteriorly and is at times superimposed on more posterior regions.  A mixture of theta and alpha rhythms are seen from the central and temporal regions. The patient does not drowse or sleep. No epileptiform activity is noted.   Hyperventilation and intermittent photic stimulation were not performed.   IMPRESSION: This is a normal awake electroencephalogram. There are no focal lateralizing or epileptiform features.   Alexis Goodell, MD Neurology [phone] 09/13/2018, 4:11 PM

## 2018-09-13 NOTE — Consult Note (Signed)
[facility] Face-to-Face Psychiatry Consult   Reason for Consult:  Mental health d/o Referring Physician:  Dr Elpidio Anis Patient Identification: Hunter Green MRN:  [id number] Principal Diagnosis: Seizures Diagnosis:  Active Problems:   PTSD (post-traumatic stress disorder)   Seizures (HCC)  Total Time spent with patient: 1 hour  Subjective:   Hunter Green is a 53 y.o. male patient admitted with seizure activity.  Reports drinking "a couple of beers" prior to coming to the ED.  Denies excessive use, BAL negative.  Does not feel this is a problem for him, no rehab .  HPI:  53 yo male who reports a history of PTSD, paranoia, and bipolar d/o.  Receives care at the [facility] in [place] and takes Prozac, Trazodone, and Prazosin (nightmares).  None of these relate to a bipolar d/o.  His paranoia, nightmares, and anxiety are related to PTSD.  He does have a seizure d/o and takes Keppra which could be provide some mood stabilization despite not being a typical mood stabilizer.  No mania noted on assessment.  Calm and cooperative.  He does complain of it being "stressful" at home because he is still living with his exwife.  Denies suicidal/homicidal ideations, hallucinations.  He does use marijuana at times and admitted to the MD that he used prior to admission, positive on UDS.  Psychiatrically stable at this time and reports he will follow up with the [place].  Per admission to the ED:  53 y.o. male presents to the emergency department in police custody secondary to "manic".  Police states that they were notified by the patient's wife that he was having bizarre behavior on their arrival patient states "I am [name] and I am going to F... Y'all up".  Patient presents to the emergency department diaphoretic handcuffed.  Patient denied any complaints on my initial evaluation.  Police states that the patient's wife states that he has a psychiatric history weapons in the home.  I was also notified by the police that his wife stated  that he has a history of seizure disorder    Past Psychiatric History:  PTSD, anxiety  Risk to Self:  none Risk to Others:  none [facility]  Past Medical History:  Past Medical History:  Diagnosis Date  . Seizures (HCC)    History reviewed. No pertinent surgical history. Family History: History reviewed. No pertinent family history. Family Psychiatric  History: none Social History:  Social History   Substance and Sexual Activity  Alcohol Use Yes     Social History   Substance and Sexual Activity  Drug Use Yes  . Types: Marijuana    Social History   Socioeconomic History  . Marital status: Single    Spouse name: Not on file  . Number of children: Not on file  . Years of education: Not on file  . Highest education level: Not on file  Occupational History  . Not on file  Social Needs  . Financial resource strain: Not on file  . Food insecurity    Worry: Not on file    Inability: Not on file  . Transportation needs    Medical: Not on file    Non-medical: Not on file  Tobacco Use  . Smoking status: Former Games developer  . Smokeless tobacco: Never Used  Substance and Sexual Activity  . Alcohol use: Yes  . Drug use: Yes    Types: Marijuana  . Sexual activity: Not on file  Lifestyle  .  Physical activity    Days per week: Not on file    Minutes per session: Not on file  . Stress: Not on file  Relationships  . Social Musician on phone: Not on file    Gets together: Not on file    Attends religious service: Not on file    Active member of club or organization: Not on file    Attends meetings of clubs or organizations: Not on file    Relationship status: Not on file  Other Topics Concern  . Not on file  Social History Narrative  . Not on file   Additional Social History:    Allergies:  No Known Allergies  Labs:  Results for orders placed or performed during the hospital encounter of  09/12/18 (from the past 48 hour(s))  Comprehensive metabolic panel     Status: Abnormal   Collection Time: 09/12/18 11:56 PM  Result Value Ref Range   Sodium 143 135 - 145 mmol/L    Comment: ELECTROLYTES REPEATED SNG   Potassium 3.0 (L) 3.5 - 5.1 mmol/L   Chloride 102 98 - 111 mmol/L   CO2 14 (L) 22 - 32 mmol/L   Glucose, Bld 210 (H) 70 - 99 mg/dL   BUN 15 6 - 20 mg/dL   Creatinine, Ser 1.61 (H) 0.61 - 1.24 mg/dL   Calcium 09.6 8.9 - 04.5 mg/dL   Total Protein 8.7 (H) 6.5 - 8.1 g/dL   Albumin 5.3 (H) 3.5 - 5.0 g/dL   AST 33 15 - 41 U/L   ALT 34 0 - 44 U/L   Alkaline Phosphatase 98 38 - 126 U/L   Total Bilirubin 0.6 0.3 - 1.2 mg/dL   GFR calc non Af Amer 48 (L) >60 mL/min   GFR calc Af Amer 55 (L) >60 mL/min   Anion gap 27 (H) 5 - 15    Comment: Performed at [facility], [address] [place], [place] [address]  Ethanol     Status: None   Collection Time: 09/12/18 11:56 PM  Result Value Ref Range   Alcohol, Ethyl (B) <10 <10 mg/dL    Comment: (NOTE) Lowest detectable limit for serum alcohol is 10 mg/dL. For medical purposes only. Performed at [facility], [address] [place], [place] [address]   Salicylate level     Status: None   Collection Time: 09/12/18 11:56 PM  Result Value Ref Range   Salicylate Lvl <7.0 2.8 - 30.0 mg/dL    Comment: Performed at [facility], [address] [place], [place] [address]  Acetaminophen level     Status: Abnormal   Collection Time: 09/12/18 11:56 PM  Result Value Ref Range   Acetaminophen (Tylenol), Serum <10 (L) 10 - 30 ug/mL    Comment: (NOTE) Therapeutic concentrations vary significantly. A range of 10-30 ug/mL  may be an effective concentration for many patients. However, some  are best treated at concentrations outside of this range. Acetaminophen concentrations >150 ug/mL at 4 hours after ingestion  and >50 ug/mL at 12 hours after ingestion are often associated with  toxic  reactions. Performed at [facility], [address] [place], [place] [address]   cbc     Status: Abnormal   Collection Time: 09/12/18 11:56 PM  Result Value Ref Range   WBC 16.9 (H) 4.0 - 10.5 K/uL   RBC 5.33 4.22 - 5.81 MIL/uL   Hemoglobin 15.9 13.0 - 17.0 g/dL   HCT 48.0  39.0 - 52.0 %   MCV 90.1 80.0 - 100.0 fL   MCH 29.8 26.0 - 34.0 pg   MCHC 33.1 30.0 - 36.0 g/dL   RDW 12.5 11.5 - 15.5 %   Platelets 205 150 - 400 K/uL   nRBC 0.0 0.0 - 0.2 %    Comment: Performed at [facility], [address], [place], [place] [address]  Magnesium     Status: None   Collection Time: 09/12/18 11:56 PM  Result Value Ref Range   Magnesium 2.2 1.7 - 2.4 mg/dL    Comment: Performed at [facility], [address] [place], [place] [address]  SARS Coronavirus 2 (CEPHEID - Performed in [place] hospital lab), Hosp Order     Status: None   Collection Time: 09/13/18  3:34 AM   Specimen: Nasopharyngeal Swab  Result Value Ref Range   SARS Coronavirus 2 NEGATIVE NEGATIVE    Comment: (NOTE) If result is NEGATIVE SARS-CoV-2 target nucleic acids are NOT DETECTED. The SARS-CoV-2 RNA is generally detectable in upper and lower  respiratory specimens during the acute phase of infection. The lowest  concentration of SARS-CoV-2 viral copies this assay can detect is 250  copies / mL. A negative result does not preclude SARS-CoV-2 infection  and should not be used as the sole basis for treatment or other  patient management decisions.  A negative result may occur with  improper specimen collection / handling, submission of specimen other  than nasopharyngeal swab, presence of viral mutation(s) within the  areas targeted by this assay, and inadequate number of viral copies  (<250 copies / mL). A negative result must be combined with clinical  observations, patient history, and epidemiological information. If result is POSITIVE SARS-CoV-2 target nucleic acids are  DETECTED. The SARS-CoV-2 RNA is generally detectable in upper and lower  respiratory specimens dur ing the acute phase of infection.  Positive  results are indicative of active infection with SARS-CoV-2.  Clinical  correlation with patient history and other diagnostic information is  necessary to determine patient infection status.  Positive results do  not rule out bacterial infection or co-infection with other viruses. If result is PRESUMPTIVE POSTIVE SARS-CoV-2 nucleic acids MAY BE PRESENT.   A presumptive positive result was obtained on the submitted specimen  and confirmed on repeat testing.  While 2019 novel coronavirus  (SARS-CoV-2) nucleic acids may be present in the submitted sample  additional confirmatory testing may be necessary for epidemiological  and / or clinical management purposes  to differentiate between  SARS-CoV-2 and other Sarbecovirus currently known to infect humans.  If clinically indicated additional testing with an alternate test  methodology [phone]) is advised. The SARS-CoV-2 RNA is generally  detectable in upper and lower respiratory sp ecimens during the acute  phase of infection. The expected result is Negative. Fact Sheet for Patients:  StrictlyIdeas.no Fact Sheet for Healthcare Providers: BankingDealers.co.za This test is not yet approved or cleared by the [place] FDA and has been authorized for detection and/or diagnosis of SARS-CoV-2 by FDA under an Emergency Use Authorization (EUA).  This EUA will remain in effect (meaning this test can be used) for the duration of the COVID-19 declaration under Section 564(b)(1) of the Act, 21 U.S.C. section 360bbb-3(b)(1), unless the authorization is terminated or revoked sooner. Performed at [facility] [address] [place], [place] [address]   Hemoglobin A1c     Status: None   Collection Time: 09/13/18  6:36 AM  Result  Value Ref Range    Hgb A1c MFr Bld 5.6 4.8 - 5.6 %    Comment: (NOTE) Pre diabetes:          5.7%-6.4% Diabetes:              >6.4% Glycemic control for   <7.0% adults with diabetes    Mean Plasma Glucose 114.02 mg/dL    Comment: Performed at [facility], [address], [place], [place] [address]  CBC with Differential/Platelet     Status: Abnormal   Collection Time: 09/13/18  6:36 AM  Result Value Ref Range   WBC 9.9 4.0 - 10.5 K/uL   RBC 4.87 4.22 - 5.81 MIL/uL   Hemoglobin 14.3 13.0 - 17.0 g/dL   HCT 60.4 54.0 - 98.1 %   MCV 85.0 80.0 - 100.0 fL   MCH 29.4 26.0 - 34.0 pg   MCHC 34.5 30.0 - 36.0 g/dL   RDW 19.1 47.8 - 29.5 %   Platelets 155 150 - 400 K/uL   nRBC 0.0 0.0 - 0.2 %   Neutrophils Relative % 78 %   Neutro Abs 7.8 (H) 1.7 - 7.7 K/uL   Lymphocytes Relative 13 %   Lymphs Abs 1.3 0.7 - 4.0 K/uL   Monocytes Relative 8 %   Monocytes Absolute 0.8 0.1 - 1.0 K/uL   Eosinophils Relative 0 %   Eosinophils Absolute 0.0 0.0 - 0.5 K/uL   Basophils Relative 0 %   Basophils Absolute 0.0 0.0 - 0.1 K/uL   Immature Granulocytes 1 %   Abs Immature Granulocytes 0.05 0.00 - 0.07 K/uL    Comment: Performed at [facility], [address] [place], [place] [address]  Basic metabolic panel     Status: Abnormal   Collection Time: 09/13/18  6:36 AM  Result Value Ref Range   Sodium 138 135 - 145 mmol/L   Potassium 3.7 3.5 - 5.1 mmol/L   Chloride 106 98 - 111 mmol/L   CO2 23 22 - 32 mmol/L   Glucose, Bld 111 (H) 70 - 99 mg/dL   BUN 16 6 - 20 mg/dL   Creatinine, Ser 8.65 (H) 0.61 - 1.24 mg/dL   Calcium 9.1 8.9 - 78.4 mg/dL   GFR calc non Af Amer >60 >60 mL/min   GFR calc Af Amer >60 >60 mL/min   Anion gap 9 5 - 15    Comment: Performed at [facility], [address] [place], [place] [address]  Glucose, capillary     Status: None   Collection Time: 09/13/18  7:40 AM  Result Value Ref Range   Glucose-Capillary 86 70 - 99 mg/dL  Glucose, capillary     Status: None    Collection Time: 09/13/18  8:45 AM  Result Value Ref Range   Glucose-Capillary 81 70 - 99 mg/dL  Glucose, capillary     Status: None   Collection Time: 09/13/18 11:01 AM  Result Value Ref Range   Glucose-Capillary 91 70 - 99 mg/dL  Urinalysis, Complete w Microscopic     Status: Abnormal   Collection Time: 09/13/18 11:22 AM  Result Value Ref Range   Color, Urine YELLOW (A) YELLOW   APPearance CLEAR (A) CLEAR   Specific Gravity, Urine 1.012 1.005 - 1.030   pH 6.0 5.0 - 8.0   Glucose, UA NEGATIVE NEGATIVE mg/dL   Hgb urine dipstick NEGATIVE NEGATIVE   Bilirubin Urine NEGATIVE NEGATIVE   Ketones, ur NEGATIVE NEGATIVE mg/dL   Protein, ur  NEGATIVE NEGATIVE mg/dL   Nitrite NEGATIVE NEGATIVE   Leukocytes,Ua NEGATIVE NEGATIVE   RBC / HPF 0-5 0 - 5 RBC/hpf   WBC, UA 0-5 0 - 5 WBC/hpf   Bacteria, UA NONE SEEN NONE SEEN   Squamous Epithelial / LPF NONE SEEN 0 - 5   Mucus PRESENT     Comment: Performed at [facility], [address] [place], [place] [address]  Urine Drug Screen, Qualitative     Status: Abnormal   Collection Time: 09/13/18 11:22 AM  Result Value Ref Range   Tricyclic, Ur Screen NONE DETECTED NONE DETECTED   Amphetamines, Ur Screen NONE DETECTED NONE DETECTED   MDMA (Ecstasy)Ur Screen NONE DETECTED NONE DETECTED   Cocaine Metabolite,Ur Sarita NONE DETECTED NONE DETECTED   Opiate, Ur Screen NONE DETECTED NONE DETECTED   Phencyclidine (PCP) Ur S NONE DETECTED NONE DETECTED   Cannabinoid 50 Ng, Ur  POSITIVE (A) NONE DETECTED   Barbiturates, Ur Screen NONE DETECTED NONE DETECTED   Benzodiazepine, Ur Scrn POSITIVE (A) NONE DETECTED   Methadone Scn, Ur NONE DETECTED NONE DETECTED    Comment: (NOTE) Tricyclics + metabolites, urine    Cutoff 1000 ng/mL Amphetamines + metabolites, urine  Cutoff 1000 ng/mL MDMA (Ecstasy), urine              Cutoff 500 ng/mL Cocaine Metabolite, urine          Cutoff 300 ng/mL Opiate + metabolites, urine        Cutoff 300  ng/mL Phencyclidine (PCP), urine         Cutoff 25 ng/mL Cannabinoid, urine                 Cutoff 50 ng/mL Barbiturates + metabolites, urine  Cutoff 200 ng/mL Benzodiazepine, urine              Cutoff 200 ng/mL Methadone, urine                   Cutoff 300 ng/mL The urine drug screen provides only a preliminary, unconfirmed analytical test result and should not be used for non-medical purposes. Clinical consideration and professional judgment should be applied to any positive drug screen result due to possible interfering substances. A more specific alternate chemical method must be used in order to obtain a confirmed analytical result. Gas chromatography / mass spectrometry (GC/MS) is the preferred confirmat ory method. Performed at [facility], [address] [place], [place] [address]   Basic metabolic panel     Status: None   Collection Time: 09/13/18 12:00 PM  Result Value Ref Range   Sodium 141 135 - 145 mmol/L   Potassium 4.0 3.5 - 5.1 mmol/L   Chloride 107 98 - 111 mmol/L   CO2 24 22 - 32 mmol/L   Glucose, Bld 98 70 - 99 mg/dL   BUN 14 6 - 20 mg/dL   Creatinine, Ser 3.081.21 0.61 - 1.24 mg/dL   Calcium 9.2 8.9 - 65.710.3 mg/dL   GFR calc non Af Amer >60 >60 mL/min   GFR calc Af Amer >60 >60 mL/min   Anion gap 10 5 - 15    Comment: Performed at [facility], [address] [place], [place] [address]    Current Facility-Administered Medications  Medication Dose Route Frequency Provider Last Rate Last Dose  . 0.9 %  sodium chloride infusion  75 mL/hr Intravenous Continuous Mansy, Vernetta HoneyJan A, MD 75 mL/hr at 09/13/18 0652 75 mL/hr at 09/13/18 0652  .  0.9 %  sodium chloride infusion  250 mL Intravenous Continuous Mansy, Vernetta HoneyJan A, MD   Stopped at 09/13/18 [phone]  . acetaminophen (TYLENOL) tablet 650 mg  650 mg Oral Q4H PRN Mansy, Jan A, MD       Or  . acetaminophen (TYLENOL) suppository 650 mg  650 mg Rectal Q4H PRN Mansy, Jan A, MD      . diphenhydrAMINE (BENADRYL)  injection 50 mg  50 mg Intravenous Once Darci CurrentBrown, [place] N, MD   Stopped at 09/13/18 0000  . enoxaparin (LOVENOX) injection 40 mg  40 mg Subcutaneous Q24H Mansy, Jan A, MD      . FLUoxetine (PROZAC) capsule 60 mg  60 mg Oral QHS Charm RingsLord, Jamison Y, NP      . haloperidol lactate (HALDOL) injection 2 mg  2 mg Intramuscular Once Darci CurrentBrown, [place] N, MD   Stopped at 09/13/18 0000  . levETIRAcetam (KEPPRA) IVPB 500 mg/100 mL premix  500 mg Intravenous Q12H Sudini, Srikar, MD      . magnesium hydroxide (MILK OF MAGNESIA) suspension 30 mL  30 mL Oral Daily PRN Mansy, Jan A, MD      . ondansetron [facility](ZOFRAN) tablet 4 mg  4 mg Oral Q6H PRN Mansy, Jan A, MD       Or  . ondansetron [facility](ZOFRAN) injection 4 mg  4 mg Intravenous Q6H PRN Mansy, Jan A, MD      . potassium chloride (KLOR-CON) packet 40 mEq  40 mEq Oral Once Mansy, Jan A, MD      . potassium chloride (KLOR-CON) packet 40 mEq  40 mEq Oral Once Mansy, Jan A, MD      . sodium chloride flush (NS) 0.9 % injection 3 mL  3 mL Intravenous Q12H Mansy, Jan A, MD   3 mL at 09/13/18 1006  . sodium chloride flush (NS) 0.9 % injection 3 mL  3 mL Intravenous PRN Mansy, Jan A, MD      . traZODone (DESYREL) tablet 100 mg  100 mg Oral QHS Charm RingsLord, Jamison Y, NP      . ziprasidone (GEODON) injection 10 mg  10 mg Intramuscular Q6H PRN Mansy, Vernetta HoneyJan A, MD        Musculoskeletal: Strength & Muscle Tone: within normal limits Gait & Station: did not witness Patient leans: N/A  Psychiatric Specialty Exam: Physical Exam  Nursing note and vitals reviewed. Constitutional: He is oriented to person, place, and time. He appears well-developed and well-nourished.  HENT:  Head: Normocephalic.  Neck: Normal range of motion.  Respiratory: Effort normal.  Musculoskeletal: Normal range of motion.  Neurological: He is alert and oriented to person, place, and time.  Psychiatric: His speech is normal and behavior is normal. Judgment and thought content normal. His mood appears anxious.  Cognition and memory are normal.    Review of Systems  Psychiatric/Behavioral: Positive for substance abuse. The patient is nervous/anxious.   All other systems reviewed and are negative.   Blood pressure 110/67, pulse 83, temperature 98.3 F (36.8 C), temperature source Oral, resp. rate 20, height 6\' 1"  (1.854 m), weight 104 kg, SpO2 100 %.Body mass index is 30.25 kg/m.  General Appearance: Casual  Eye Contact:  Good  Speech:  Normal Rate  Volume:  Normal  Mood:  Anxious, mild  Affect:  Congruent  Thought Process:  Coherent and Descriptions of Associations: Intact  Orientation:  Full (Time, Place, and Person)  Thought Content:  WDL and Logical  Suicidal Thoughts:  No  Homicidal Thoughts:  No  Memory:  Immediate;   Good Recent;   Good Remote;   Good  Judgement:  Fair  Insight:  Fair  Psychomotor Activity:  Decreased  Concentration:  Concentration: Good and Attention Span: Good  Recall:  Good  Fund of Knowledge:  Good  Language:  Good  Akathisia:  No  Handed:  Right  AIMS (if indicated):     Assets:  Housing Leisure Time Physical Health Resilience Social Support  ADL's:  Intact  Cognition:  WNL  Sleep:        Treatment Plan Summary: PTSD: -Restarted Prozac 60 mg daily  Insomnia: -Restarted Trazodone 100 mg at bedtime  Nightmares: -Restarted Prazosin 1 mg at bedtime  Disposition: No evidence of imminent risk to self or others at present.   Patient does not meet criteria for psychiatric inpatient admission. Discussed crisis plan, support from social network, calling 911, coming to the Emergency Department, and calling Suicide Hotline.  Nanine MeansJamison Lord, NP 09/13/2018 3:26 PM

## 2018-09-13 NOTE — Progress Notes (Signed)
Same day note  Patient still drowsy  *Breakthrough seizures *Bipolar disorder with psychosis and delusions *Hypokalemia *Acute kidney injury *Leukocytosis *Hyperglycemia with known history of diabetes mellitus.  Continue seizure medications.  Neurology consulted.  Psychiatry consulted.  I sent a message to on-call psychiatry nurse practitioner.  Landscape architect.  Sliding scale insulin and Accu-Cheks.  Monitor WBC.  IV fluids.

## 2018-09-13 NOTE — ED Notes (Signed)
ED TO INPATIENT HANDOFF REPORT  ED Nurse Name and Phone #: Erie NoeVanessa [phone]  S Name/Age/Gender Hunter Green 53 y.o. male Room/Bed: ED20A/ED20AA  Code Status   Code Status: Full Code  Home/SNF/Other Home Patient oriented to: self, place and situation Is this baseline? No   Triage Complete: Triage complete  Chief Complaint mental health problem; seizures  Triage Note Pt [place] via [employer] [employer] in forensic handcuffs. Per officers, [place], altered, manic and combative. Pt arrived to ED, diaphoretic, alert to self only. During assessment, pt begins to have seizure like activity. MD at bedside. See MAR for intervention.    Allergies No Known Allergies  Level of Care/Admitting Diagnosis ED Disposition    ED Disposition Condition Comment   Admit  Hospital Area: [facility] [[id number]]  Level of Care: Med-Surg [16]  Covid Evaluation: Confirmed COVID Positive  Diagnosis: Seizures [facility](HCC) [[id number]]  Admitting Physician: Hannah BeatMANSY, JAN A [[id number]]  Attending Physician: Hannah BeatMANSY, JAN A [[id number]]  Estimated length of stay: 3 - 4 days  Certification:: I certify this patient will need inpatient services for at least 2 midnights  PT Class (Do Not Modify): Inpatient [101]  PT Acc Code (Do Not Modify): Private [1]       B Medical/Surgery History Past Medical History:  Diagnosis Date  . Seizures (HCC)    History reviewed. No pertinent surgical history.   A IV Location/Drains/Wounds Patient Lines/Drains/Airways Status   Active Line/Drains/Airways    Name:   Placement date:   Placement time:   Site:   Days:   Peripheral IV 09/13/18 Left Antecubital   09/13/18    0015    Antecubital   less than 1          Intake/Output Last 24 hours No intake or output data in the 24 hours ending 09/13/18 0326  Labs/Imaging Results for orders placed or performed during the hospital encounter of 09/12/18 (from the past 48 hour(s))  Comprehensive  metabolic panel     Status: Abnormal   Collection Time: 09/12/18 11:56 PM  Result Value Ref Range   Sodium 143 135 - 145 mmol/L    Comment: ELECTROLYTES REPEATED SNG   Potassium 3.0 (L) 3.5 - 5.1 mmol/L   Chloride 102 98 - 111 mmol/L   CO2 14 (L) 22 - 32 mmol/L   Glucose, Bld 210 (H) 70 - 99 mg/dL   BUN 15 6 - 20 mg/dL   Creatinine, Ser 0.861.63 (H) 0.61 - 1.24 mg/dL   Calcium 57.810.1 8.9 - 46.910.3 mg/dL   Total Protein 8.7 (H) 6.5 - 8.1 g/dL   Albumin 5.3 (H) 3.5 - 5.0 g/dL   AST 33 15 - 41 U/L   ALT 34 0 - 44 U/L   Alkaline Phosphatase 98 38 - 126 U/L   Total Bilirubin 0.6 0.3 - 1.2 mg/dL   GFR calc non Af Amer 48 (L) >60 mL/min   GFR calc Af Amer 55 (L) >60 mL/min   Anion gap 27 (H) 5 - 15    Comment: Performed at [facility], [address] [place], [place] [address]  Ethanol     Status: None   Collection Time: 09/12/18 11:56 PM  Result Value Ref Range   Alcohol, Ethyl (B) <10 <10 mg/dL    Comment: (NOTE) Lowest detectable limit for serum alcohol is 10 mg/dL. For medical purposes only. Performed at [facility], [address] [place], [place] [address]   Salicylate level  Status: None   Collection Time: 09/12/18 11:56 PM  Result Value Ref Range   Salicylate Lvl <1.9 2.8 - 30.0 mg/dL    Comment: Performed at [facility], [address] [place], [place] [address]  Acetaminophen level     Status: Abnormal   Collection Time: 09/12/18 11:56 PM  Result Value Ref Range   Acetaminophen (Tylenol), Serum <10 (L) 10 - 30 ug/mL    Comment: (NOTE) Therapeutic concentrations vary significantly. A range of 10-30 ug/mL  may be an effective concentration for many patients. However, some  are best treated at concentrations outside of this range. Acetaminophen concentrations >150 ug/mL at 4 hours after ingestion  and >50 ug/mL at 12 hours after ingestion are often associated with  toxic reactions. Performed at [facility], [address]  [place], [place] [address]   cbc     Status: Abnormal   Collection Time: 09/12/18 11:56 PM  Result Value Ref Range   WBC 16.9 (H) 4.0 - 10.5 K/uL   RBC 5.33 4.22 - 5.81 MIL/uL   Hemoglobin 15.9 13.0 - 17.0 g/dL   HCT 48.0 39.0 - 52.0 %   MCV 90.1 80.0 - 100.0 fL   MCH 29.8 26.0 - 34.0 pg   MCHC 33.1 30.0 - 36.0 g/dL   RDW 12.5 11.5 - 15.5 %   Platelets 205 150 - 400 K/uL   nRBC 0.0 0.0 - 0.2 %    Comment: Performed at [facility], [address], [place], [place] [address]   No results found.  Pending Labs Unresulted Labs (From admission, onward)    Start     Ordered   09/13/18 0251  HIV antibody (Routine Testing)  Once,   STAT     09/13/18 0306   09/13/18 0011  Urine Drug Screen, Qualitative  Once,   STAT     09/13/18 0011          Vitals/Pain Today's Vitals   09/13/18 0130 09/13/18 0200 09/13/18 0230 09/13/18 0300  BP: 119/76 123/75 130/88 (!) 126/94  Pulse: 95 81 72 72  Resp: 19 (!) 21 16 17   Temp:      TempSrc:      SpO2: (!) 84% (!) 89% 96% 100%    Isolation Precautions No active isolations  Medications Medications  haloperidol lactate (HALDOL) injection 2 mg (0 mg Intramuscular Hold 09/13/18 0000)  diphenhydrAMINE (BENADRYL) injection 50 mg (0 mg Intravenous Hold 09/13/18 0000)  0.9 %  sodium chloride infusion (has no administration in time range)  enoxaparin (LOVENOX) injection 40 mg (has no administration in time range)  LORazepam (ATIVAN) injection 1 mg (has no administration in time range)  levETIRAcetam (KEPPRA) tablet 500 mg (has no administration in time range)  acetaminophen (TYLENOL) tablet 650 mg (has no administration in time range)    Or  acetaminophen (TYLENOL) suppository 650 mg (has no administration in time range)  magnesium hydroxide (MILK OF MAGNESIA) suspension 30 mL (has no administration in time range)  ondansetron (ZOFRAN) tablet 4 mg (has no administration in time range)    Or  ondansetron (ZOFRAN) injection 4 mg (has no  administration in time range)  LORazepam (ATIVAN) injection 2 mg (2 mg Intravenous Given 09/13/18 0003)  LORazepam (ATIVAN) injection 2 mg (2 mg Intravenous Given 09/13/18 0000)  levETIRAcetam (KEPPRA) IVPB 1000 mg/100 mL premix (0 mg Intravenous Stopped 09/13/18 0324)    Mobility walks with person assist High fall risk   Focused Assessments Neuro Assessment Handoff:  If patient is a Neuro Trauma and patient is going to OR before floor call report to [place] nurse: [phone] or [phone]     R Recommendations: See Admitting Provider Note  Report given to:   Additional Notes:

## 2018-09-13 NOTE — ED Provider Notes (Signed)
[facility] Emergency Department Provider Note   First MD Initiated Contact with Patient 09/12/18 2358     (approximate)  I have reviewed the triage vital signs and the nursing notes.  Level 5 caveat history review of system limited secondary to altered mental status HISTORY  Chief Complaint Mental Health Problem and Seizures    HPI Hunter Green is a 53 y.o. male presents to the emergency department in police custody secondary to "manic".  Police states that they were notified by the patient's wife that he was having bizarre behavior on their arrival patient states "I am [name] and I am going to F... Y'all up".  Patient presents to the emergency department diaphoretic handcuffed.  Patient denied any complaints on my initial evaluation.  Police states that the patient's wife states that he has a psychiatric history weapons in the home.  I was also notified by the police that his wife stated that he has a history of seizure disorder        Past Medical History:  Diagnosis Date   Seizures (HCC)     There are no active problems to display for this patient.   History reviewed. No pertinent surgical history.  Prior to Admission medications   Not on File    Allergies Patient has no known allergies.  History reviewed. No pertinent family history.  Social History Social History   Tobacco Use   Smoking status: Former Smoker   Smokeless tobacco: Never Used  Substance Use Topics   Alcohol use: Yes   Drug use: Yes    Types: Marijuana    Review of Systems Constitutional: No fever/chills Eyes: No visual changes. ENT: No sore throat. Cardiovascular: Denies chest pain. Respiratory: Denies shortness of breath. Gastrointestinal: No abdominal pain.  No nausea, no vomiting.  No diarrhea.  No constipation. Genitourinary: Negative for dysuria. Musculoskeletal: Negative for neck pain.  Negative for back pain. Integumentary: Negative for  rash. Neurological: Negative for headaches, focal weakness or numbness. Psychiatric:  Reported "manic"   ____________________________________________   PHYSICAL EXAM:  VITAL SIGNS: ED Triage Vitals [09/13/18 0015]  Enc Vitals Group     BP 127/70     Pulse Rate (!) 108     Resp (!) 21     Temp 99.9 F (37.7 C)     Temp Source Rectal     SpO2 98 %     Weight      Height      Head Circumference      Peak Flow      Pain Score      Pain Loc      Pain Edu?      Excl. in GC?     Constitutional: Bizarre affect, diaphoretic Eyes: Conjunctivae are normal. PERRL. EOMI. Head: Atraumatic.Marland Kitchen. Mouth/Throat: Mucous membranes are moist. Oropharynx non-erythematous. Neck: No stridor.  Cardiovascular: Normal rate, regular rhythm. Good peripheral circulation. Grossly normal heart sounds. Respiratory: Normal respiratory effort.  No retractions. No audible wheezing. Gastrointestinal: Soft and nontender. No distention.  Musculoskeletal: No lower extremity tenderness nor edema. No gross deformities of extremities. Neurologic:  Normal speech and language. No gross focal neurologic deficits are appreciated.  Skin:  Skin is warm, dry and intact. No rash noted. Psychiatric: Mood and affect are normal. Speech and behavior are normal.  ____________________________________________   LABS (all labs ordered are listed, but only abnormal results are displayed)  Labs Reviewed  COMPREHENSIVE METABOLIC PANEL - Abnormal; Notable for the following components:  Result Value   Potassium 3.0 (*)    CO2 14 (*)    Glucose, Bld 210 (*)    Creatinine, Ser 1.63 (*)    Total Protein 8.7 (*)    Albumin 5.3 (*)    GFR calc non Af Amer 48 (*)    GFR calc Af Amer 55 (*)    Anion gap 27 (*)    All other components within normal limits  ACETAMINOPHEN LEVEL - Abnormal; Notable for the following components:   Acetaminophen (Tylenol), Serum <10 (*)    All other components within normal limits  CBC -  Abnormal; Notable for the following components:   WBC 16.9 (*)    All other components within normal limits  ETHANOL  SALICYLATE LEVEL  URINE DRUG SCREEN, QUALITATIVE ([facility] ONLY)     PROCEDURES   Procedure(s) performed (including Critical Care):  .Critical Care Performed by: Darci CurrentBrown, [place] N, MD Authorized by: Darci CurrentBrown, [place] N, MD   Critical care provider statement:    Critical care time (minutes):  30   Critical care time was exclusive of:  Separately billable procedures and treating other patients (Seizure)   Critical care was time spent personally by me on the following activities:  Development of treatment plan with patient or surrogate, discussions with consultants, evaluation of patient's response to treatment, examination of patient, obtaining history from patient or surrogate, ordering and performing treatments and interventions, ordering and review of laboratory studies, ordering and review of radiographic studies, pulse oximetry, re-evaluation of patient's condition and review of old charts     ____________________________________________   INITIAL IMPRESSION / MDM / ASSESSMENT AND PLAN / ED COURSE  As part of my medical decision making, I reviewed the following data within the electronic MEDICAL RECORD NUMBER   After my initial evaluation I was notified by the nursing staff that the patient was seizing so I promptly responded to the room where found patient to be having a generalized tonic-clonic seizure.  Handcuffs were removed patient placed lateral recumbent position.  2 mg of IM Ativan administered.  Patient with continued seizure-like activity and as such an additional 1 mg of Ativan was administered IV.  Patient also given Keppra 1 g IV.  Patient seizure activity then resolved.  Patient discussed with Dr. Arville CareMansy for hospital admission for further evaluation and management.  Of note patient is involuntarily committed advised documents that the patient would also require  psychiatric evaluation       ____________________________________________  FINAL CLINICAL IMPRESSION(S) / ED DIAGNOSES  Final diagnoses:  Acute psychosis (HCC)  Seizure (HCC)     MEDICATIONS GIVEN DURING THIS VISIT:  Medications  haloperidol lactate (HALDOL) injection 2 mg (0 mg Intramuscular Hold 09/13/18 0000)  diphenhydrAMINE (BENADRYL) injection 50 mg (0 mg Intravenous Hold 09/13/18 0000)  levETIRAcetam (KEPPRA) IVPB 1000 mg/100 mL premix (has no administration in time range)  LORazepam (ATIVAN) injection 2 mg (2 mg Intravenous Given 09/13/18 0003)  LORazepam (ATIVAN) injection 2 mg (2 mg Intravenous Given 09/13/18 0000)     ED Discharge Orders    None      *Please note:  Hunter Green was evaluated in Emergency Department on 09/13/2018 for the symptoms described in the history of present illness. He was evaluated in the context of the global COVID-19 pandemic, which necessitated consideration that the patient might be at risk for infection with the SARS-CoV-2 virus that causes COVID-19. Institutional protocols and algorithms that pertain to the evaluation of patients at risk for COVID-19 are  in a state of rapid change based on information released by regulatory bodies including the CDC and federal and state organizations. These policies and algorithms were followed during the patient's care in the ED.  Some ED evaluations and interventions may be delayed as a result of limited staffing during the pandemic.*  Note:  This document was prepared using Dragon voice recognition software and may include unintentional dictation errors.   Gregor Hams, MD 09/13/18 [phone]

## 2018-09-13 NOTE — ED Triage Notes (Signed)
Pt arrived via [employer] [place]. Per officers, [place] in [place], altered, manic and combative. Pt arrived to ED, diaphoretic, alert to self only. During assessment, pt begins to have seizure like activity. MD at bedside. See MAR for intervention.

## 2018-09-13 NOTE — ED Notes (Signed)
Pt awake at this time. RN informs pt where and why he is at the hospital. Pt able to answer questions. Pt oriented to self and situation. Pt is calm and cooperative.

## 2018-09-13 NOTE — H&P (Addendum)
[facility] [place] [facility]   PATIENT NAME: Hunter Green    MR#:  [id number]  DATE OF BIRTH:  1965/06/17  DATE OF ADMISSION:  09/12/2018  PRIMARY CARE PHYSICIAN: [facility]   REQUESTING/REFERRING PHYSICIAN: Bayard MalesBrown, [place], MD  CHIEF COMPLAINT:   Chief Complaint  Patient presents with  . Mental Health Problem  . Seizures    HISTORY OF PRESENT ILLNESS:  Hunter CarolinaHoward Zuckerman  is a 53 y.o. African-American male with a known history of bipolar and seizure disorder, who presented to the emergency room with the onset of seizures in the emergency room preceded by delusions of being "[name]" which were witnessed by his wife who called the police.  The patient's wife stated that he has a psychiatric history and has had weapons in the house.  The patient was postictal after his seizures.  He was given 20 g of IM Ativan in the ER followed by 1 mg of IV Ativan for continued seizure-like activity.  He was then given 1 g of IV Keppra then his seizure activity resolved.  He was involuntarily committed.  During my interview he was more alert and cooperative.  He denied any fever or chills.  He denied any chest pain or dyspnea or palpitations.  No nausea or vomiting or abdominal pain.  When he was asked if he thinks he is [name] he stated "is that illegal" when asked again he stated "No.  Can I go home now?".  No cough or wheezing or shortness of breath.  He had no recent sick exposures or exposure to COVID-19.  When he came to the ER, blood pressure was 145/81 with a pulse of 108 respiratory to 27 pulse currently 96% on room air with a temperature of 99.9 and later 97.8.  Labs are remarkable for hypokalemia of 3 and hyperglycemia of 210 with anion gap of 27 and CO2 14 CBC showed leukocytosis of 16.9.  Alcohol level was less than 10 and salicylate less than 7.  The patient was given IV Ativan and Keppra as mentioned above as well as 50 mg of IV Benadryl and 2 mg of IV Haldol.  He  will be admitted to medical monitored bed for further evaluation and management. PAST MEDICAL HISTORY:  Bipolar disorder, seizure disorder, marijuana abuse   PAST SURGICAL HISTORY:  Vasectomy  SOCIAL HISTORY:   Social History   Tobacco Use  . Smoking status: Former Games developermoker  . Smokeless tobacco: Never Used  Substance Use Topics  . Alcohol use: Yes    FAMILY HISTORY:  History reviewed. No pertinent family history.  He denied any familial diseases.  DRUG ALLERGIES:  No Known Allergies  REVIEW OF SYSTEMS:   ROS As per history of present illness. All pertinent systems were reviewed above. Constitutional,  HEENT, cardiovascular, respiratory, GI, GU, musculoskeletal, neuro, psychiatric, endocrine,  integumentary and hematologic systems were reviewed and are otherwise  negative/unremarkable except for positive findings mentioned above in the HPI.   MEDICATIONS AT HOME:   Prior to Admission medications   Not on File      VITAL SIGNS:  Blood pressure (!) 126/94, pulse 72, temperature 99.9 F (37.7 C), temperature source Rectal, resp. rate 17, SpO2 100 %.  PHYSICAL EXAMINATION:  Physical Exam  GENERAL:  53 y.o.-year-old African-American male patient lying in the bed with no acute distress.  EYES: Pupils equal, round, reactive to light and accommodation. No scleral icterus. Extraocular muscles intact.  HEENT: Head atraumatic, normocephalic. Oropharynx and  nasopharynx clear.  NECK:  Supple, no jugular venous distention. No thyroid enlargement, no tenderness.  LUNGS: Normal breath sounds bilaterally, no wheezing, rales,rhonchi or crepitation. No use of accessory muscles of respiration.  CARDIOVASCULAR: Regular rate and rhythm, S1, S2 normal. No murmurs, rubs, or gallops.  ABDOMEN: Soft, nondistended, nontender. Bowel sounds present. No organomegaly or mass.  EXTREMITIES: No pedal edema, cyanosis, or clubbing.  NEUROLOGIC: Cranial nerves II through XII are intact. Muscle  strength 5/5 in all extremities. Sensation intact. Gait not checked.  PSYCHIATRIC: The patient is alert and cooperative.  He was oriented x3.  Bizarre affect and good eye contact. SKIN: No obvious rash, lesion, or ulcer.   LABORATORY PANEL:   CBC Recent Labs  Lab 09/12/18 2356  WBC 16.9*  HGB 15.9  HCT 48.0  PLT 205   ------------------------------------------------------------------------------------------------------------------  Chemistries  Recent Labs  Lab 09/12/18 2356  NA 143  K 3.0*  CL 102  CO2 14*  GLUCOSE 210*  BUN 15  CREATININE 1.63*  CALCIUM 10.1  AST 33  ALT 34  ALKPHOS 98  BILITOT 0.6   ------------------------------------------------------------------------------------------------------------------  Cardiac Enzymes No results for input(s): TROPONINI in the last 168 hours. ------------------------------------------------------------------------------------------------------------------  RADIOLOGY:  No results found.    IMPRESSION AND PLAN:   1.  Breakthrough seizures with history of seizure disorder. The patient will be admitted to medical monitored bed.  He will be continued on p.o. Keppra.  A neurology consultation and EEG will be obtained this a.m.  I notified Dr. Doy Mince about the patient.  Will place on seizure precautions.  It is highly likely that he is noncompliant and has not been taking any seizures medications.  He will be placed on PRN Ativan.  We will obtain a head CT scan without contrast.  2.  Hypokalemia.  His potassium will be replaced.  3.  Acute kidney injury.  He will be hydrated with IV normal saline and will follow his BMP.  4.  Hyperglycemia with high anion gap and acidosis concerning for new onset type 2 diabetes mellitus with diabetic ketoacidosis.  He will be placed on frequent fingerstick glucose measures with resistant NovoLog supplemental coverage in addition to hydration with normal saline.  We will check his  hemoglobin A1c.  If he has been taking antipsychotics this could have been induced by 1 of them.  5.  Bipolar disorder with associated delusions and acute psychosis.  Psychiatry consultation will be obtained.  Will place him on PRN IM Geodon.  6.  Leukocytosis.  This could be stress demargination due to seizures.  Will obtain a urinalysis for further assessment.  7.  DVT prophylaxis.  Subcutaneous Lovenox.  All the records are reviewed and case discussed with ED provider. The plan of care was discussed in details with the patient (and family). I answered all questions. The patient agreed to proceed with the above mentioned plan. Further management will depend upon hospital course.   CODE STATUS: Full code  TOTAL TIME TAKING CARE OF THIS PATIENT: 55 minutes.    Christel Mormon M.D on 09/13/2018 at [place] AM  Pager - [phone]  After 6pm go to [email or web address] - Proofreader  [facility] [place] [facility]  Office  [phone]  CC: Primary care physician; [facility]   Note: This dictation was prepared with Dragon dictation along with smaller phrase technology. Any transcriptional errors that result from this process are unintentional.

## 2018-09-13 NOTE — Progress Notes (Signed)
MEDICATION RELATED CONSULT NOTE - INITIAL   Pharmacy Consult for drug-drug interactions  Indication: seizures and start of keppra   No Known Allergies  Patient Measurements:   Vital Signs: Temp: 99.9 F (37.7 C) (07/17 0015) Temp Source: Rectal (07/17 0015) BP: 123/75 (07/17 0200) Pulse Rate: 81 (07/17 0200) Intake/Output from previous day: No intake/output data recorded. Intake/Output from this shift: No intake/output data recorded.  Labs: Recent Labs    09/12/18 2356  WBC 16.9*  HGB 15.9  HCT 48.0  PLT 205  CREATININE 1.63*  ALBUMIN 5.3*  PROT 8.7*  AST 33  ALT 34  ALKPHOS 98  BILITOT 0.6   CrCl cannot be calculated (Unknown ideal weight.).   Microbiology: No results found for this or any previous visit (from the past 720 hour(s)).  Medical History: Past Medical History:  Diagnosis Date  . Seizures (HCC)     Medications:  Scheduled:  . diphenhydrAMINE  50 mg Intravenous Once  . enoxaparin (LOVENOX) injection  40 mg Subcutaneous Q24H  . haloperidol lactate  2 mg Intramuscular Once  . levETIRAcetam  500 mg Oral BID  . LORazepam  1 mg Intravenous Q5 min    Assessment: Patient admitted for mental health issues and seizures w/ only PMH of seizures. Patient takes no PTA meds. Patient was started on keppra bid for seizures and prn ativan as well as prn haldol for agitation purposes.  Goal of Therapy:  Avoidance of drug drug interactions w/ anti-epileptic medications.  Plan:  No drug drug interactions at this time.  Will continue to monitor as changes arise.  Tobie Lords, PharmD, BCPS Clinical Pharmacist 09/13/2018,3:23 AM

## 2018-09-13 NOTE — ED Notes (Signed)
Pt post ictal, sleeping/snoring. Pt on cardiac monitor. VS WNL.

## 2018-09-13 NOTE — Progress Notes (Signed)
EEG completed, results pending. 

## 2018-09-14 ENCOUNTER — Inpatient Hospital Stay

## 2018-09-14 LAB — BASIC METABOLIC PANEL
Anion gap: 8 (ref 5–15)
BUN: 13 mg/dL (ref 6–20)
CO2: 25 mmol/L (ref 22–32)
Calcium: 8.8 mg/dL — ABNORMAL LOW (ref 8.9–10.3)
Chloride: 105 mmol/L (ref 98–111)
Creatinine, Ser: 1.22 mg/dL (ref 0.61–1.24)
GFR calc Af Amer: >60 mL/min (ref >60)
GFR calc non Af Amer: >60 mL/min (ref >60)
Glucose, Bld: 93 mg/dL (ref 70–99)
Potassium: 3.5 mmol/L (ref 3.5–5.1)
Sodium: 138 mmol/L (ref 135–145)

## 2018-09-14 LAB — CBC WITH DIFFERENTIAL/PLATELET
Abs Immature Granulocytes: 0.02 10*3/uL (ref 0.00–0.07)
Basophils Absolute: 0 10*3/uL (ref 0.0–0.1)
Basophils Relative: 0 %
Eosinophils Absolute: 0.1 10*3/uL (ref 0.0–0.5)
Eosinophils Relative: 1 %
HCT: 40.3 % (ref 39.0–52.0)
Hemoglobin: 13.8 g/dL (ref 13.0–17.0)
Immature Granulocytes: 0 %
Lymphocytes Relative: 30 %
Lymphs Abs: 2.4 10*3/uL (ref 0.7–4.0)
MCH: 29.7 pg (ref 26.0–34.0)
MCHC: 34.2 g/dL (ref 30.0–36.0)
MCV: 86.7 fL (ref 80.0–100.0)
Monocytes Absolute: 0.5 10*3/uL (ref 0.1–1.0)
Monocytes Relative: 7 %
Neutro Abs: 4.8 10*3/uL (ref 1.7–7.7)
Neutrophils Relative %: 62 %
Platelets: 148 10*3/uL — ABNORMAL LOW (ref 150–400)
RBC: 4.65 MIL/uL (ref 4.22–5.81)
RDW: 12.6 % (ref 11.5–15.5)
WBC: 7.8 10*3/uL (ref 4.0–10.5)
nRBC: 0 % (ref 0.0–0.2)

## 2018-09-14 LAB — GLUCOSE, CAPILLARY: Glucose-Capillary: 100 mg/dL — ABNORMAL HIGH (ref 70–99)

## 2018-09-14 LAB — HIV ANTIBODY (ROUTINE TESTING W REFLEX): HIV Screen 4th Generation wRfx: NONREACTIVE

## 2018-09-14 MED ORDER — LAMOTRIGINE 100 MG PO TABS
100.0000 mg | ORAL_TABLET | Freq: Every morning | ORAL | Status: DC
  Administered 2018-09-14 – 2018-09-15 (×2): 100 mg via ORAL
  Filled 2018-09-14 (×2): qty 1

## 2018-09-14 NOTE — Progress Notes (Signed)
This provider and the TTS counselor met with the client again and explained this provider just needed someone he trusted to safeguard his gun for a couple of days for safety.  Explained that the EDP committed him based on what was said in the ED (threat to harm the police).  This provider can release the IVC when he gets in touch with someone.  Patient was calm, pleasant and stated understanding.  He did try to call his sister but she did not answer.  This provider gave his sitter my cell number so when he does get the gun secured, I would come and release the IVC.  Paperwork is in place so he will not be delayed once the gun is secured.  Dr Dwyane Dee, psychiatric medical director, is also requesting this prior to IVC release.  Waylan Boga, PMHNP

## 2018-09-14 NOTE — Progress Notes (Signed)
Pt was upset early in the shift about why he was at the hospital and wanted to go home. Pt called 911 a few times and was getting very agitated with why he cant go home. [place] police was called as well as Therapist, art. Pt wanted to go home to his dog which serves as an emotional stabilizer for him. Pt was also concerned about the gun he said was not locked up at his house. Pt became calm after a while and has been calm since. Pt is still reporting pain on left wrist. Pt wrist has gotten more swollen since the beginning of the shift. X-ray was ordered. No other signs of distress noted. Will continue to monitor.

## 2018-09-14 NOTE — Consult Note (Signed)
[facility] Face-to-Face Psychiatry Consult   Reason for Consult:  Mental health d/o Referring Physician:  Dr Elpidio Anis Patient Identification: Hunter Green MRN:  [id number] Principal Diagnosis: Seizures Diagnosis:  Active Problems:   PTSD (post-traumatic stress disorder)   Seizures (HCC)  Total Time spent with patient: 1 hour  Subjective:   Hunter Green is a 53 y.o. male patient admitted with seizure activity.  "I want to go home.  I am ready.:" Patient was IVC'd in the ED after threatening police, gun noted in documentation.  Denies current suicidal/homicidal ideations, hallucinations, and substance abuse.  The psychiatric medical director, Dr Lucianne Muss, requests the client have someone he trusts to secure his gun for a few days for safety.  Patient adamantly refused this and stated he did not have a criminal record and did not want the person living in his house to have his gun (even though this was not mentioned) as they do have a criminal record.  He reported yesterday that he was living with his exwife.  The patient is mentally stable at this time but based on what he said (noted below), the request is made.  This is a recommendation from psychiatry.  HPI:  53 yo male who reports a history of PTSD, paranoia, and bipolar d/o.  Receives care at the [facility] in [place] and takes Prozac, Trazodone, and Prazosin (nightmares).  None of these relate to a bipolar d/o.  His paranoia, nightmares, and anxiety are related to PTSD.  He does have a seizure d/o and takes Keppra which could be provide some mood stabilization despite not being a typical mood stabilizer.  No mania noted on assessment.  Calm and cooperative.  He does complain of it being "stressful" at home because he is still living with his exwife.  Denies suicidal/homicidal ideations, hallucinations.  He does use marijuana at times and admitted to the MD that he used prior to admission, positive on UDS.  Psychiatrically stable at this time and reports he will  follow up with the [place].  Per admission to the ED:  53 y.o. male presents to the emergency department in police custody secondary to "manic".  Police states that they were notified by the patient's wife that he was having bizarre behavior on their arrival patient states "I am [name] and I am going to F... Y'all up".  Patient presents to the emergency department diaphoretic handcuffed.  Patient denied any complaints on my initial evaluation.  Police states that the patient's wife states that he has a psychiatric history weapons in the home.  I was also notified by the police that his wife stated that he has a history of seizure disorder    Past Psychiatric History:  PTSD, anxiety  Risk to Self:  none Risk to Others:  none [facility]  Past Medical History:  Past Medical History:  Diagnosis Date  . Seizures (HCC)    History reviewed. No pertinent surgical history. Family History: History reviewed. No pertinent family history. Family Psychiatric  History: none Social History:  Social History   Substance and Sexual Activity  Alcohol Use Yes     Social History   Substance and Sexual Activity  Drug Use Yes  . Types: Marijuana    Social History   Socioeconomic History  . Marital status: Single    Spouse name: Not on file  . Number of children: Not on file  . Years of education: Not on file  . Highest  education level: Not on file  Occupational History  . Not on file  Social Needs  . Financial resource strain: Not on file  . Food insecurity    Worry: Not on file    Inability: Not on file  . Transportation needs    Medical: Not on file    Non-medical: Not on file  Tobacco Use  . Smoking status: Former Games developermoker  . Smokeless tobacco: Never Used  Substance and Sexual Activity  . Alcohol use: Yes  . Drug use: Yes    Types: Marijuana  . Sexual activity: Not on file  Lifestyle  . Physical activity    Days per week: Not  on file    Minutes per session: Not on file  . Stress: Not on file  Relationships  . Social Musicianconnections    Talks on phone: Not on file    Gets together: Not on file    Attends religious service: Not on file    Active member of club or organization: Not on file    Attends meetings of clubs or organizations: Not on file    Relationship status: Not on file  Other Topics Concern  . Not on file  Social History Narrative  . Not on file   Additional Social History:    Allergies:  No Known Allergies  Labs:  Results for orders placed or performed during the hospital encounter of 09/12/18 (from the past 48 hour(s))  Comprehensive metabolic panel     Status: Abnormal   Collection Time: 09/12/18 11:56 PM  Result Value Ref Range   Sodium 143 135 - 145 mmol/L    Comment: ELECTROLYTES REPEATED SNG   Potassium 3.0 (L) 3.5 - 5.1 mmol/L   Chloride 102 98 - 111 mmol/L   CO2 14 (L) 22 - 32 mmol/L   Glucose, Bld 210 (H) 70 - 99 mg/dL   BUN 15 6 - 20 mg/dL   Creatinine, Ser 4.541.63 (H) 0.61 - 1.24 mg/dL   Calcium 09.810.1 8.9 - 11.910.3 mg/dL   Total Protein 8.7 (H) 6.5 - 8.1 g/dL   Albumin 5.3 (H) 3.5 - 5.0 g/dL   AST 33 15 - 41 U/L   ALT 34 0 - 44 U/L   Alkaline Phosphatase 98 38 - 126 U/L   Total Bilirubin 0.6 0.3 - 1.2 mg/dL   GFR calc non Af Amer 48 (L) >60 mL/min   GFR calc Af Amer 55 (L) >60 mL/min   Anion gap 27 (H) 5 - 15    Comment: Performed at [facility], [address] [place], [place] [address]  Ethanol     Status: None   Collection Time: 09/12/18 11:56 PM  Result Value Ref Range   Alcohol, Ethyl (B) <10 <10 mg/dL    Comment: (NOTE) Lowest detectable limit for serum alcohol is 10 mg/dL. For medical purposes only. Performed at [facility], [address] [place], [place] [address]   Salicylate level     Status: None   Collection Time: 09/12/18 11:56 PM  Result Value Ref Range   Salicylate Lvl <7.0 2.8 - 30.0 mg/dL    Comment: Performed at [facility], [address] [place], [place] [address]  Acetaminophen level     Status: Abnormal   Collection Time: 09/12/18 11:56 PM  Result Value Ref Range   Acetaminophen (Tylenol), Serum <10 (L) 10 - 30 ug/mL    Comment: (NOTE) Therapeutic concentrations vary significantly. A range of 10-30 ug/mL  may  be an effective concentration for many patients. However, some  are best treated at concentrations outside of this range. Acetaminophen concentrations >150 ug/mL at 4 hours after ingestion  and >50 ug/mL at 12 hours after ingestion are often associated with  toxic reactions. Performed at [facility], [address] [place], [place] [address]   cbc     Status: Abnormal   Collection Time: 09/12/18 11:56 PM  Result Value Ref Range   WBC 16.9 (H) 4.0 - 10.5 K/uL   RBC 5.33 4.22 - 5.81 MIL/uL   Hemoglobin 15.9 13.0 - 17.0 g/dL   HCT 60.4 54.0 - 98.1 %   MCV 90.1 80.0 - 100.0 fL   MCH 29.8 26.0 - 34.0 pg   MCHC 33.1 30.0 - 36.0 g/dL   RDW 19.1 47.8 - 29.5 %   Platelets 205 150 - 400 K/uL   nRBC 0.0 0.0 - 0.2 %    Comment: Performed at [facility], [address] [place], [place] [address]  Magnesium     Status: None   Collection Time: 09/12/18 11:56 PM  Result Value Ref Range   Magnesium 2.2 1.7 - 2.4 mg/dL    Comment: Performed at [facility], [address] [place], [place] [address]  SARS Coronavirus 2 (CEPHEID - Performed in [facility] hospital lab), Hosp Order     Status: None   Collection Time: 09/13/18  3:34 AM   Specimen: Nasopharyngeal Swab  Result Value Ref Range   SARS Coronavirus 2 NEGATIVE NEGATIVE    Comment: (NOTE) If result is NEGATIVE SARS-CoV-2 target nucleic acids are NOT DETECTED. The SARS-CoV-2 RNA is generally detectable in upper and lower  respiratory specimens during the acute phase of infection. The lowest  concentration of SARS-CoV-2 viral copies this assay can detect is 250  copies / mL. A negative result does  not preclude SARS-CoV-2 infection  and should not be used as the sole basis for treatment or other  patient management decisions.  A negative result may occur with  improper specimen collection / handling, submission of specimen other  than nasopharyngeal swab, presence of viral mutation(s) within the  areas targeted by this assay, and inadequate number of viral copies  (<250 copies / mL). A negative result must be combined with clinical  observations, patient history, and epidemiological information. If result is POSITIVE SARS-CoV-2 target nucleic acids are DETECTED. The SARS-CoV-2 RNA is generally detectable in upper and lower  respiratory specimens dur ing the acute phase of infection.  Positive  results are indicative of active infection with SARS-CoV-2.  Clinical  correlation with patient history and other diagnostic information is  necessary to determine patient infection status.  Positive results do  not rule out bacterial infection or co-infection with other viruses. If result is PRESUMPTIVE POSTIVE SARS-CoV-2 nucleic acids MAY BE PRESENT.   A presumptive positive result was obtained on the submitted specimen  and confirmed on repeat testing.  While 2019 novel coronavirus  (SARS-CoV-2) nucleic acids may be present in the submitted sample  additional confirmatory testing may be necessary for epidemiological  and / or clinical management purposes  to differentiate between  SARS-CoV-2 and other Sarbecovirus currently known to infect humans.  If clinically indicated additional testing with an alternate test  methodology [phone]) is advised. The SARS-CoV-2 RNA is generally  detectable in upper and lower respiratory sp ecimens during the acute  phase of infection. The expected result is Negative. Fact Sheet for Patients:  BoilerBrush.com.cy Fact  Sheet for Healthcare Providers: https://pope.com/https://www.fda.gov/media/136313/download This test is not yet approved or  cleared by the [place] FDA and has been authorized for detection and/or diagnosis of SARS-CoV-2 by FDA under an Emergency Use Authorization (EUA).  This EUA will remain in effect (meaning this test can be used) for the duration of the COVID-19 declaration under Section 564(b)(1) of the Act, 21 U.S.C. section 360bbb-3(b)(1), unless the authorization is terminated or revoked sooner. Performed at [facility], [address] [place], [place] [address]   HIV antibody (Routine Testing)     Status: None   Collection Time: 09/13/18  6:36 AM  Result Value Ref Range   HIV Screen 4th Generation wRfx Non Reactive Non Reactive    Comment: (NOTE) Performed At: [facility] [address] [place], [place] [id number] Jolene SchimkeNagendra Sanjai MD WN:[id number]:[phone]   Hemoglobin A1c     Status: None   Collection Time: 09/13/18  6:36 AM  Result Value Ref Range   Hgb A1c MFr Bld 5.6 4.8 - 5.6 %    Comment: (NOTE) Pre diabetes:          5.7%-6.4% Diabetes:              >6.4% Glycemic control for   <7.0% adults with diabetes    Mean Plasma Glucose 114.02 mg/dL    Comment: Performed at [facility], [address] [place], [place] [address]  CBC with Differential/Platelet     Status: Abnormal   Collection Time: 09/13/18  6:36 AM  Result Value Ref Range   WBC 9.9 4.0 - 10.5 K/uL   RBC 4.87 4.22 - 5.81 MIL/uL   Hemoglobin 14.3 13.0 - 17.0 g/dL   HCT 25.941.4 56.339.0 - 87.552.0 %   MCV 85.0 80.0 - 100.0 fL   MCH 29.4 26.0 - 34.0 pg   MCHC 34.5 30.0 - 36.0 g/dL   RDW 64.312.4 32.911.5 - 51.815.5 %   Platelets 155 150 - 400 K/uL   nRBC 0.0 0.0 - 0.2 %   Neutrophils Relative % 78 %   Neutro Abs 7.8 (H) 1.7 - 7.7 K/uL   Lymphocytes Relative 13 %   Lymphs Abs 1.3 0.7 - 4.0 K/uL   Monocytes Relative 8 %   Monocytes Absolute 0.8 0.1 - 1.0 K/uL   Eosinophils Relative 0 %   Eosinophils Absolute 0.0 0.0 - 0.5 K/uL   Basophils Relative 0 %   Basophils Absolute 0.0 0.0 - 0.1 K/uL   Immature Granulocytes 1  %   Abs Immature Granulocytes 0.05 0.00 - 0.07 K/uL    Comment: Performed at [facility], [address] [place], [place] [address]  Basic metabolic panel     Status: Abnormal   Collection Time: 09/13/18  6:36 AM  Result Value Ref Range   Sodium 138 135 - 145 mmol/L   Potassium 3.7 3.5 - 5.1 mmol/L   Chloride 106 98 - 111 mmol/L   CO2 23 22 - 32 mmol/L   Glucose, Bld 111 (H) 70 - 99 mg/dL   BUN 16 6 - 20 mg/dL   Creatinine, Ser 0.631.27 (H) 0.61 - 1.24 mg/dL   Calcium 9.1 8.9 - 01.610.3 mg/dL   GFR calc non Af Amer >60 >60 mL/min   GFR calc Af Amer >60 >60 mL/min   Anion gap 9 5 - 15    Comment: Performed at [facility], [address] [place], [place] [address]  Glucose, capillary     Status: None  Collection Time: 09/13/18  7:40 AM  Result Value Ref Range   Glucose-Capillary 86 70 - 99 mg/dL  Glucose, capillary     Status: None   Collection Time: 09/13/18  8:45 AM  Result Value Ref Range   Glucose-Capillary 81 70 - 99 mg/dL  Glucose, capillary     Status: None   Collection Time: 09/13/18 11:01 AM  Result Value Ref Range   Glucose-Capillary 91 70 - 99 mg/dL  Urinalysis, Complete w Microscopic     Status: Abnormal   Collection Time: 09/13/18 11:22 AM  Result Value Ref Range   Color, Urine YELLOW (A) YELLOW   APPearance CLEAR (A) CLEAR   Specific Gravity, Urine 1.012 1.005 - 1.030   pH 6.0 5.0 - 8.0   Glucose, UA NEGATIVE NEGATIVE mg/dL   Hgb urine dipstick NEGATIVE NEGATIVE   Bilirubin Urine NEGATIVE NEGATIVE   Ketones, ur NEGATIVE NEGATIVE mg/dL   Protein, ur NEGATIVE NEGATIVE mg/dL   Nitrite NEGATIVE NEGATIVE   Leukocytes,Ua NEGATIVE NEGATIVE   RBC / HPF 0-5 0 - 5 RBC/hpf   WBC, UA 0-5 0 - 5 WBC/hpf   Bacteria, UA NONE SEEN NONE SEEN   Squamous Epithelial / LPF NONE SEEN 0 - 5   Mucus PRESENT     Comment: Performed at [facility], [address] [place], [place] [address]  Urine Drug Screen, Qualitative     Status: Abnormal    Collection Time: 09/13/18 11:22 AM  Result Value Ref Range   Tricyclic, Ur Screen NONE DETECTED NONE DETECTED   Amphetamines, Ur Screen NONE DETECTED NONE DETECTED   MDMA (Ecstasy)Ur Screen NONE DETECTED NONE DETECTED   Cocaine Metabolite,Ur [place] NONE DETECTED NONE DETECTED   Opiate, Ur Screen NONE DETECTED NONE DETECTED   Phencyclidine (PCP) Ur S NONE DETECTED NONE DETECTED   Cannabinoid 50 Ng, Ur [place] POSITIVE (A) NONE DETECTED   Barbiturates, Ur Screen NONE DETECTED NONE DETECTED   Benzodiazepine, Ur Scrn POSITIVE (A) NONE DETECTED   Methadone Scn, Ur NONE DETECTED NONE DETECTED    Comment: (NOTE) Tricyclics + metabolites, urine    Cutoff 1000 ng/mL Amphetamines + metabolites, urine  Cutoff 1000 ng/mL MDMA (Ecstasy), urine              Cutoff 500 ng/mL Cocaine Metabolite, urine          Cutoff 300 ng/mL Opiate + metabolites, urine        Cutoff 300 ng/mL Phencyclidine (PCP), urine         Cutoff 25 ng/mL Cannabinoid, urine                 Cutoff 50 ng/mL Barbiturates + metabolites, urine  Cutoff 200 ng/mL Benzodiazepine, urine              Cutoff 200 ng/mL Methadone, urine                   Cutoff 300 ng/mL The urine drug screen provides only a preliminary, unconfirmed analytical test result and should not be used for non-medical purposes. Clinical consideration and professional judgment should be applied to any positive drug screen result due to possible interfering substances. A more specific alternate chemical method must be used in order to obtain a confirmed analytical result. Gas chromatography / mass spectrometry (GC/MS) is the preferred confirmat ory method. Performed at [facility], [address] [place], [place] [address]   Basic metabolic panel     Status: None   Collection Time:  09/13/18 12:00 PM  Result Value Ref Range   Sodium 141 135 - 145 mmol/L   Potassium 4.0 3.5 - 5.1 mmol/L   Chloride 107 98 - 111 mmol/L   CO2 24 22 - 32 mmol/L   Glucose, Bld  98 70 - 99 mg/dL   BUN 14 6 - 20 mg/dL   Creatinine, Ser 1.21 0.61 - 1.24 mg/dL   Calcium 9.2 8.9 - 10.3 mg/dL   GFR calc non Af Amer >60 >60 mL/min   GFR calc Af Amer >60 >60 mL/min   Anion gap 10 5 - 15    Comment: Performed at [facility], [address] [place], [place] [address]  Glucose, capillary     Status: Abnormal   Collection Time: 09/13/18  9:12 PM  Result Value Ref Range   Glucose-Capillary 107 (H) 70 - 99 mg/dL  CBC with Differential/Platelet     Status: Abnormal   Collection Time: 09/14/18  3:55 AM  Result Value Ref Range   WBC 7.8 4.0 - 10.5 K/uL   RBC 4.65 4.22 - 5.81 MIL/uL   Hemoglobin 13.8 13.0 - 17.0 g/dL   HCT 40.3 39.0 - 52.0 %   MCV 86.7 80.0 - 100.0 fL   MCH 29.7 26.0 - 34.0 pg   MCHC 34.2 30.0 - 36.0 g/dL   RDW 12.6 11.5 - 15.5 %   Platelets 148 (L) 150 - 400 K/uL   nRBC 0.0 0.0 - 0.2 %   Neutrophils Relative % 62 %   Neutro Abs 4.8 1.7 - 7.7 K/uL   Lymphocytes Relative 30 %   Lymphs Abs 2.4 0.7 - 4.0 K/uL   Monocytes Relative 7 %   Monocytes Absolute 0.5 0.1 - 1.0 K/uL   Eosinophils Relative 1 %   Eosinophils Absolute 0.1 0.0 - 0.5 K/uL   Basophils Relative 0 %   Basophils Absolute 0.0 0.0 - 0.1 K/uL   Immature Granulocytes 0 %   Abs Immature Granulocytes 0.02 0.00 - 0.07 K/uL    Comment: Performed at [facility] [address] [place], [place] [address]  Basic metabolic panel     Status: Abnormal   Collection Time: 09/14/18  3:55 AM  Result Value Ref Range   Sodium 138 135 - 145 mmol/L   Potassium 3.5 3.5 - 5.1 mmol/L   Chloride 105 98 - 111 mmol/L   CO2 25 22 - 32 mmol/L   Glucose, Bld 93 70 - 99 mg/dL   BUN 13 6 - 20 mg/dL   Creatinine, Ser 1.22 0.61 - 1.24 mg/dL   Calcium 8.8 (L) 8.9 - 10.3 mg/dL   GFR calc non Af Amer >60 >60 mL/min   GFR calc Af Amer >60 >60 mL/min   Anion gap 8 5 - 15    Comment: Performed at [facility], [address] [place], [place] [address]    Current  Facility-Administered Medications  Medication Dose Route Frequency Provider Last Rate Last Dose  . 0.9 %  sodium chloride infusion  75 mL/hr Intravenous Continuous Mansy, Arvella Merles, MD 75 mL/hr at 09/14/18 0616 75 mL/hr at 09/14/18 0616  . acetaminophen (TYLENOL) tablet 650 mg  650 mg Oral Q4H PRN Mansy, Jan A, MD       Or  . acetaminophen (TYLENOL) suppository 650 mg  650 mg Rectal Q4H PRN Mansy, Jan A, MD      . enoxaparin (LOVENOX) injection 40 mg  40 mg Subcutaneous Q24H Mansy, Arvella Merles, MD      .  FLUoxetine (PROZAC) capsule 60 mg  60 mg Oral QHS Charm Rings, NP   60 mg at 09/13/18 2307  . ibuprofen (ADVIL) tablet 400 mg  400 mg Oral Q4H PRN Mansy, Jan A, MD   400 mg at 09/13/18 2338  . levETIRAcetam (KEPPRA) IVPB 500 mg/100 mL premix  500 mg Intravenous Q12H Milagros Loll, MD   Stopped at 09/14/18 0348  . magnesium hydroxide (MILK OF MAGNESIA) suspension 30 mL  30 mL Oral Daily PRN Mansy, Jan A, MD      . ondansetron [facility]) tablet 4 mg  4 mg Oral Q6H PRN Mansy, Jan A, MD       Or  . ondansetron [facility]) injection 4 mg  4 mg Intravenous Q6H PRN Mansy, Jan A, MD      . prazosin (MINIPRESS) capsule 1 mg  1 mg Oral QHS Charm Rings, NP   1 mg at 09/13/18 2307  . sodium chloride flush (NS) 0.9 % injection 3 mL  3 mL Intravenous Q12H Mansy, Jan A, MD   3 mL at 09/13/18 1006  . sodium chloride flush (NS) 0.9 % injection 3 mL  3 mL Intravenous PRN Mansy, Jan A, MD      . traZODone (DESYREL) tablet 100 mg  100 mg Oral QHS Charm Rings, NP   100 mg at 09/13/18 2307  . ziprasidone (GEODON) injection 10 mg  10 mg Intramuscular Q6H PRN Mansy, Vernetta Honey, MD        Musculoskeletal: Strength & Muscle Tone: within normal limits Gait & Station: did not witness Patient leans: N/A  Psychiatric Specialty Exam: Physical Exam  Nursing note and vitals reviewed. Constitutional: He is oriented to person, place, and time. He appears well-developed and well-nourished.  HENT:  Head: Normocephalic.  Neck:  Normal range of motion.  Respiratory: Effort normal.  Musculoskeletal: Normal range of motion.  Neurological: He is alert and oriented to person, place, and time.  Psychiatric: His speech is normal and behavior is normal. Judgment and thought content normal. His mood appears anxious. Cognition and memory are normal.    Review of Systems  Psychiatric/Behavioral: Positive for substance abuse. The patient is nervous/anxious.   All other systems reviewed and are negative.   Blood pressure 136/87, pulse 81, temperature 98.6 F (37 C), temperature source Oral, resp. rate 16, height  (1.854 m), weight 104 kg, SpO2 100 %.Body mass index is 30.25 kg/m.  General Appearance: Casual  Eye Contact:  Good  Speech:  Normal Rate  Volume:  Normal  Mood:  Anxious, mild  Affect:  Congruent  Thought Process:  Coherent and Descriptions of Associations: Intact  Orientation:  Full (Time, Place, and Person)  Thought Content:  WDL and Logical  Suicidal Thoughts:  No  Homicidal Thoughts:  No  Memory:  Immediate;   Good Recent;   Good Remote;   Good  Judgement:  Fair  Insight:  Fair  Psychomotor Activity:  Decreased  Concentration:  Concentration: Good and Attention Span: Good  Recall:  Good  Fund of Knowledge:  Good  Language:  Good  Akathisia:  No  Handed:  Right  AIMS (if indicated):     Assets:  Housing Leisure Time Physical Health Resilience Social Support  ADL's:  Intact  Cognition:  WNL  Sleep:        Treatment Plan Summary: PTSD: -Continued Prozac 60 mg daily  Insomnia: -Continueded Trazodone 100 mg at bedtime  Nightmares: -Continued Prazosin 1 mg at bedtime  Disposition: No evidence of imminent risk to self or others at present.   Patient does not meet criteria for psychiatric inpatient admission. Discussed crisis plan, support from social network, calling 911, coming to the Emergency Department, and calling Suicide Hotline.  Nanine MeansJamison Lord, NP 09/14/2018 1:40 PM

## 2018-09-14 NOTE — Progress Notes (Signed)
MEDICATION RELATED CONSULT NOTE - INITIAL   Pharmacy Consult for drug-drug interactions  Indication: seizures and start of keppra   No Known Allergies  Patient Measurements:   Vital Signs: Temp: 98.6 F (37 C) (07/18 0332) Temp Source: Oral (07/18 0332) BP: 136/87 (07/18 0332) Pulse Rate: 81 (07/18 0332) Intake/Output from previous day: 07/17 0701 - 07/18 0700 In: 1821.7 [P.O.:120; I.V.:1501.7; IV Piggyback:200] Out: -  Intake/Output from this shift: Total I/O In: 240 [P.O.:240] Out: -   Labs: Recent Labs    09/12/18 2356 09/13/18 0636 09/13/18 1200 09/14/18 0355  WBC 16.9* 9.9  --  7.8  HGB 15.9 14.3  --  13.8  HCT 48.0 41.4  --  40.3  PLT 205 155  --  148*  CREATININE 1.63* 1.27* 1.21 1.22  MG 2.2  --   --   --   ALBUMIN 5.3*  --   --   --   PROT 8.7*  --   --   --   AST 33  --   --   --   ALT 34  --   --   --   ALKPHOS 98  --   --   --   BILITOT 0.6  --   --   --    Estimated Creatinine Clearance: 89.7 mL/min (by C-G formula based on SCr of 1.22 mg/dL).   Microbiology: Recent Results (from the past 720 hour(s))  SARS Coronavirus 2 (CEPHEID - Performed in [facility] lab), Hosp Order     Status: None   Collection Time: 09/13/18  3:34 AM   Specimen: Nasopharyngeal Swab  Result Value Ref Range Status   SARS Coronavirus 2 NEGATIVE NEGATIVE Final    Comment: (NOTE) If result is NEGATIVE SARS-CoV-2 target nucleic acids are NOT DETECTED. The SARS-CoV-2 RNA is generally detectable in upper and lower  respiratory specimens during the acute phase of infection. The lowest  concentration of SARS-CoV-2 viral copies this assay can detect is 250  copies / mL. A negative result does not preclude SARS-CoV-2 infection  and should not be used as the sole basis for treatment or other  patient management decisions.  A negative result may occur with  improper specimen collection / handling, submission of specimen other  than nasopharyngeal swab, presence of  viral mutation(s) within the  areas targeted by this assay, and inadequate number of viral copies  (<250 copies / mL). A negative result must be combined with clinical  observations, patient history, and epidemiological information. If result is POSITIVE SARS-CoV-2 target nucleic acids are DETECTED. The SARS-CoV-2 RNA is generally detectable in upper and lower  respiratory specimens dur ing the acute phase of infection.  Positive  results are indicative of active infection with SARS-CoV-2.  Clinical  correlation with patient history and other diagnostic information is  necessary to determine patient infection status.  Positive results do  not rule out bacterial infection or co-infection with other viruses. If result is PRESUMPTIVE POSTIVE SARS-CoV-2 nucleic acids MAY BE PRESENT.   A presumptive positive result was obtained on the submitted specimen  and confirmed on repeat testing.  While 2019 novel coronavirus  (SARS-CoV-2) nucleic acids may be present in the submitted sample  additional confirmatory testing may be necessary for epidemiological  and / or clinical management purposes  to differentiate between  SARS-CoV-2 and other Sarbecovirus currently known to infect humans.  If clinically indicated additional testing with an alternate test  methodology [phone]([id number]) is advised. The SARS-CoV-2 RNA  is generally  detectable in upper and lower respiratory sp ecimens during the acute  phase of infection. The expected result is Negative. Fact Sheet for Patients:  StrictlyIdeas.no Fact Sheet for Healthcare Providers: BankingDealers.co.za This test is not yet approved or cleared by the [place] FDA and has been authorized for detection and/or diagnosis of SARS-CoV-2 by FDA under an Emergency Use Authorization (EUA).  This EUA will remain in effect (meaning this test can be used) for the duration of the COVID-19 declaration under Section  564(b)(1) of the Act, 21 U.S.C. section 360bbb-3(b)(1), unless the authorization is terminated or revoked sooner. Performed at [facility], [address] [place], [place] [address]     Medical History: Past Medical History:  Diagnosis Date  . Seizures (HCC)     Medications:  Scheduled:  . enoxaparin (LOVENOX) injection  40 mg Subcutaneous Q24H  . FLUoxetine  60 mg Oral QHS  . prazosin  1 mg Oral QHS  . sodium chloride flush  3 mL Intravenous Q12H  . traZODone  100 mg Oral QHS    Assessment: Patient admitted for mental health issues and seizures w/ only PMH of seizures. Patient takes no PTA meds. Patient was started on levetiracetam 77mmg IV BID.  Goal of Therapy:  Avoidance of drug drug interactions w/ anti-epileptic medications.  Plan:  Patient may be at risk for some CNS depression due to concomitant use of levetiracetam, fluoxetine, trazodone, and ziprasidone.   Will continue to monitor.   Currie Paris, RPh  09/14/2018,9:52 AM

## 2018-09-14 NOTE — TOC Initial Note (Signed)
Transition of Care [facility]) - Initial/Assessment Note    Patient Details  Name: Hunter Green MRN: [id number] Date of Birth: 01/28/1966  [facility] CM/SW Contact:    Zettie Pho, LCSW Phone Number: 09/14/2018, 1:33 PM  Clinical Narrative:     The social worker contacted the veteran on his room phone to discuss his concern about his gun being unsecured in his home. [name] presented with raised voice, angered tone, and defensive but rapid speech: "That is my gun, and I am legally allowed to have the gun. I don't know why that psychiatrist came in here talking about the gun. No one can tell me I can't have my gun." The social worker used calm tone and direct discussion with the veteran to assist him in understanding that the social worker called about the weapon due to the patient indicating to nursing that he is worried about the weapon being unsecured. [name] shared that he does not feel trust in his ex-wife with whom he lives; he refused to give her contact information, and he shared: "She's a felon and can't touch it anyway." [name] did provide verbal permission to contact his daughter, and he verified the phone number on file.  The social worker attempted to contact the veteran's daughter and left a HIPPA compliant VM. The social worker reviewed signed psychiatry notes that indicate the patient has not indicated homicidal ideation or suicidal ideations. [name] allegedly made a threat against police in the ED; however, no other mention of direct threats of harm have been noted. The Social worker cannot warn unless a direct and specific perceived third party has been threatened and that violence would be imminent.   [name] continues to be under IVC. Evaluation for inpatient psychiatric care is pending. The social worker will continue to follow the patient while admitted.    Expected Discharge Plan: Psychiatric Hospital Barriers to Discharge: Continued Medical  Work up, Psych Bed not available, Unsafe home situation, Family Issues   Patient Goals and CMS Choice Patient states their goals for this hospitalization and ongoing recovery are:: "I want to get home and check on my cat and dog."   Choice offered to / list presented to : NA  Expected Discharge Plan and Services Expected Discharge Plan: [facility] In-house Referral: NA Discharge Planning Services: Other - See comment(Refusal to transfer to [facility])   Living arrangements for the past 2 months: Single Family Home                                      Prior Living Arrangements/Services Living arrangements for the past 2 months: Single Family Home Lives with:: Spouse Patient language and need for interpreter reviewed:: No Do you feel safe going back to the place where you live?: Yes      Need for Family Participation in Patient Care: Yes (Comment)(Patient under IVC) Care giver support system in place?: Yes (comment)(Patient has daughter that he feels is a support that he can trust.)   Criminal Activity/Legal Involvement Pertinent to Current Situation/Hospitalization: Yes - Comment as needed([name])  Activities of Daily Living Home Assistive Devices/Equipment: None ADL Screening (condition at time of admission) Patient's cognitive ability adequate to safely complete daily activities?: Yes Is the patient deaf or have difficulty hearing?: No Does the patient have difficulty seeing, even when wearing  glasses/contacts?: No Does the patient have difficulty concentrating, remembering, or making decisions?: No Patient able to express need for assistance with ADLs?: Yes Does the patient have difficulty dressing or bathing?: No Independently performs ADLs?: Yes (appropriate for developmental age) Does the patient have difficulty walking or climbing stairs?: No Weakness of Legs: None Weakness of Arms/Hands:  None  Permission Sought/Granted Permission sought to share information with : Family Supports Permission granted to share information with : Yes, Verbal Permission Granted  Share Information with NAME: Hunter Green     Permission granted to share info w Relationship: Daughter  Permission granted to share info w Contact Information: [phone]  Emotional Assessment Appearance:: Appears stated age Attitude/Demeanor/Rapport: Aggressive (Verbally and/or physically), Hostile, Inconsistent, Paranoid, Gracious Affect (typically observed): Angry, Agitated, Apprehensive, Defensive, Explosive, Frustrated, Irritable, Restless Orientation: : Oriented to Self, Oriented to Place, Oriented to  Time, Oriented to Situation Alcohol / Substance Use: Alcohol Use, Illicit Drugs Psych Involvement: Yes (comment)  Admission diagnosis:  Seizure (HCC) [R56.9] Acute psychosis (HCC) [F23] Patient Active Problem List   Diagnosis Date Noted  . Seizures (HCC) 09/13/2018  . PTSD (post-traumatic stress disorder) 09/13/2018   [facility]P:  [facility]   [facility] [place] - [address] [place] [address] Phone: [phone] Fax: [phone]     Social Determinants of Health (SDOH) Interventions    Readmission Risk Interventions No flowsheet data found.

## 2018-09-14 NOTE — Progress Notes (Signed)
[facility] [place] [facility]   PATIENT NAME: Hunter Green    MR#:  [id number]  DATE OF BIRTH:  01/07/1966  SUBJECTIVE:  CHIEF COMPLAINT:   Chief Complaint  Patient presents with   Mental Health Problem   Seizures   Patient [place] to have [place] [facility] [place] this [place] and getting agitated and requesting to go home.  IVC in place with one-to-one sitter at bedside. Patient denied any complaints to me.  Later reevaluated by psychiatry nurse practitioner today who notified me that the plan is to continue IVC for now because because he made threats to hurt police but will not allow anyone to secure his gun for a few days.    REVIEW OF SYSTEMS:  Review of Systems  Constitutional: Negative for chills and fever.  HENT: Negative for hearing loss and tinnitus.   Eyes: Negative for blurred vision and double vision.  Respiratory: Negative for cough and hemoptysis.   Cardiovascular: Negative for chest pain and palpitations.  Gastrointestinal: Negative for heartburn and nausea.  Genitourinary: Negative for dysuria and urgency.  Musculoskeletal: Negative for myalgias and neck pain.  Skin: Negative for itching and rash.  Neurological: Negative for dizziness and headaches.  Psychiatric/Behavioral: Negative for depression and hallucinations.    DRUG ALLERGIES:  No Known Allergies VITALS:  Blood pressure 136/87, pulse 81, temperature 98.6 F (37 C), temperature source Oral, resp. rate 16, height 6\' 1"  (1.854 m), weight 104 kg, SpO2 100 %. PHYSICAL EXAMINATION:  Physical Exam   GENERAL:  53 y.o.-year-old African-American male patient lying in the bed with no acute distress.  EYES: Pupils equal, round, reactive to light and accommodation. No scleral icterus. Extraocular muscles intact.  HEENT: Head atraumatic, normocephalic. Oropharynx and nasopharynx clear.  NECK:  Supple, no jugular venous distention. No thyroid enlargement, no tenderness.    LUNGS: Normal breath sounds bilaterally, no wheezing, rales,rhonchi or crepitation. No use of accessory muscles of respiration.  CARDIOVASCULAR: Regular rate and rhythm, S1, S2 normal. No murmurs, rubs, or gallops.  ABDOMEN: Soft, nondistended, nontender. Bowel sounds present. No organomegaly or mass.  EXTREMITIES: No pedal edema, cyanosis, or clubbing.  NEUROLOGIC: Cranial nerves II through XII are intact. Muscle strength 5/5 in all extremities. Sensation intact. Gait not checked.  PSYCHIATRIC: The patient is alert and cooperative.  He was oriented x3.   SKIN: No obvious rash, lesion, or ulcer.   LABORATORY PANEL:  Male CBC Recent Labs  Lab 09/14/18 0355  WBC 7.8  HGB 13.8  HCT 40.3  PLT 148*   ------------------------------------------------------------------------------------------------------------------ Chemistries  Recent Labs  Lab 09/12/18 2356  09/14/18 0355  NA 143   < > 138  K 3.0*   < > 3.5  CL 102   < > 105  CO2 14*   < > 25  GLUCOSE 210*   < > 93  BUN 15   < > 13  CREATININE 1.63*   < > 1.22  CALCIUM 10.1   < > 8.8*  MG 2.2  --   --   AST 33  --   --   ALT 34  --   --   ALKPHOS 98  --   --   BILITOT 0.6  --   --    < > = values in this interval not displayed.   RADIOLOGY:  Dg Hand Complete Left  Result Date: 09/14/2018 CLINICAL DATA:  LEFT hand pain and swelling after being placed in handcuffs. EXAM: LEFT HAND -  COMPLETE 3+ VIEW COMPARISON:  None. FINDINGS: Osseous alignment is normal. No fracture line or displaced fracture fragment seen. No degenerative change. Soft tissues about the LEFT hand are unremarkable. IMPRESSION: Negative. Electronically Signed   By: Franki Cabot M.D.   On: 09/14/2018 04:49   ASSESSMENT AND PLAN:   1.  Breakthrough seizures with history of seizure disorder.  Patient seen by neurologist.  Appreciate input.  EEG was normal.  No further seizures.   Recommendation is to continue Keppra and Lamictal.  No further imaging from  neuro standpoint Seizure precautions  2.  Hypokalemia.  Replaced   3.  Acute kidney injury.   Renal function improved with IV fluids  4.  Hyperglycemia with high anion gap and acidosis Resolved   Glycosylated hemoglobin level of 5.6   5.  Bipolar disorder with associated delusions and acute psychosis.   Patient seen by psychiatrist.  Patient already has IVC in place. Patient reported to have been calling the [facility] police this morning and getting agitated and requesting to go home.  IVC in place with one-to-one sitter at bedside. Patient denied any complaints to me.  Later reevaluated by psychiatry nurse practitioner today who notified me that the plan is to continue IVC for now because because he made threats to hurt police but will not allow anyone to secure his gun for a few days. [facility].  Notified by case manager that patient declined transfer to [place]. Anticipate transfer to inpatient psychiatric unit if accepted by psychiatric service  6.  Leukocytosis.   Resolved. This could be stress demargination due to seizures.    DVT prophylaxis.  Subcutaneous Lovenox.  All the records are reviewed and case discussed with Care Management/Social Worker. Management plans discussed with the patient, family and they are in agreement.  CODE STATUS: Full Code  TOTAL TIME TAKING CARE OF THIS PATIENT: 36 minutes.   More than 50% of the time was spent in counseling/coordination of care: YES  POSSIBLE D/C IN 2 DAYS, DEPENDING ON CLINICAL CONDITION.   Alegra Rost M.D on 09/14/2018 at 1:46 PM  Between 7am to 6pm - Pager - [phone]  After 6pm go to [email or web address] - Proofreader  [facility] [place] [facility]  Office  [phone]  CC: Primary care physician; [facility]  Note: This dictation was prepared with Dragon dictation along with smaller phrase technology. Any transcriptional errors that result from this process are unintentional.

## 2018-09-14 NOTE — Consult Note (Signed)
Reason for Consult: seizure activity  Referring Physician: Dr. Enid Baasjie   CC: seizure activity   HPI: Hunter Green is an 53 y.o. male of PTSD, paranoia, and bipolar d/o.  [facility] [place] and takes Prozac, Trazodone, and Prazosin (nightmares).  None of these relate to a bipolar d/o.  His paranoia, nightmares, and anxiety are related to PTSD.  He does have a seizure d/o and takes lamictal and suspected Keppra. Presented with agitation and aggression. Suspected seizure episode. He appears to not be compliant with his medications. Currentrly back to baseline.  EEG no seizure activity.    Past Medical History:  Diagnosis Date  . Seizures (HCC)     History reviewed. No pertinent surgical history.  History reviewed. No pertinent family history.  Social History:  reports that he has quit smoking. He has never used smokeless tobacco. He reports current alcohol use. He reports current drug use. Drug: Marijuana.  No Known Allergies  Medications: I have reviewed the patient's current medications.  ROS: History obtained from the patient  General ROS: negative for - chills, fatigue, fever, night sweats, weight gain or weight loss Psychological ROS: negative for - behavioral disorder, hallucinations, memory difficulties, mood swings or suicidal ideation Ophthalmic ROS: negative for - blurry vision, double vision, eye pain or loss of vision ENT ROS: negative for - epistaxis, nasal discharge, oral lesions, sore throat, tinnitus or vertigo Allergy and Immunology ROS: negative for - hives or itchy/watery eyes Hematological and Lymphatic ROS: negative for - bleeding problems, bruising or swollen lymph nodes Endocrine ROS: negative for - galactorrhea, hair pattern changes, polydipsia/polyuria or temperature intolerance Respiratory ROS: negative for - cough, hemoptysis, shortness of breath or wheezing Cardiovascular ROS: negative for - chest pain, dyspnea on exertion, edema or  irregular heartbeat Gastrointestinal ROS: negative for - abdominal pain, diarrhea, hematemesis, nausea/vomiting or stool incontinence Genito-Urinary ROS: negative for - dysuria, hematuria, incontinence or urinary frequency/urgency Musculoskeletal ROS: negative for - joint swelling or muscular weakness Neurological ROS: as noted in HPI Dermatological ROS: negative for rash and skin lesion changes  Physical Examination: Blood pressure 136/87, pulse 81, temperature 98.6 F (37 C), temperature source Oral, resp. rate 16, height 6\' 1"  (1.854 m), weight 104 kg, SpO2 100 %.  Neurological Examination   Mental Status: Alert, oriented, thought content appropriate.  Speech fluent without evidence of aphasia.  Able to follow 3 step commands without difficulty. Cranial Nerves: II: Discs flat bilaterally; Visual fields grossly normal, pupils equal, round, reactive to light and accommodation III,IV, VI: ptosis not present, extra-ocular motions intact bilaterally V,VII: smile symmetric, facial light touch sensation normal bilaterally VIII: hearing normal bilaterally IX,X: gag reflex present XI: bilateral shoulder shrug XII: midline tongue extension Motor: Right : Upper extremity   5/5    Left:     Upper extremity   5/5  Lower extremity   5/5     Lower extremity   5/5 Tone and bulk:normal tone throughout; no atrophy noted Sensory: Pinprick and light touch intact throughout, bilaterally Deep Tendon Reflexes: 1+ and symmetric throughout Plantars: Right: downgoing   Left: downgoing Cerebellar: normal finger-to-nose, normal rapid alternating movements and normal heel-to-shin test Gait: not tested.       Laboratory Studies:   Basic Metabolic Panel: Recent Labs  Lab 09/12/18 2356 09/13/18 0636 09/13/18 1200 09/14/18 0355  NA 143 138 141 138  K 3.0* 3.7 4.0 3.5  CL 102 106 107 105  CO2 14* 23 24 25   GLUCOSE 210* 111*  98 93  BUN 15 16 14 13   CREATININE 1.63* 1.27* 1.21 1.22  CALCIUM 10.1  9.1 9.2 8.8*  MG 2.2  --   --   --     Liver Function Tests: Recent Labs  Lab 09/12/18 2356  AST 33  ALT 34  ALKPHOS 98  BILITOT 0.6  PROT 8.7*  ALBUMIN 5.3*   No results for input(s): LIPASE, AMYLASE in the last 168 hours. No results for input(s): AMMONIA in the last 168 hours.  CBC: Recent Labs  Lab 09/12/18 2356 09/13/18 0636 09/14/18 0355  WBC 16.9* 9.9 7.8  NEUTROABS  --  7.8* 4.8  HGB 15.9 14.3 13.8  HCT 48.0 41.4 40.3  MCV 90.1 85.0 86.7  PLT 205 155 148*    Cardiac Enzymes: No results for input(s): CKTOTAL, CKMB, CKMBINDEX, TROPONINI in the last 168 hours.  BNP: Invalid input(s): POCBNP  CBG: Recent Labs  Lab 09/13/18 0740 09/13/18 0845 09/13/18 1101 09/13/18 2112  GLUCAP 86 81 91 107*    Microbiology: Results for orders placed or performed during the hospital encounter of 09/12/18  SARS Coronavirus 2 (CEPHEID - Performed in [place] hospital lab), Hosp Order     Status: None   Collection Time: 09/13/18  3:34 AM   Specimen: Nasopharyngeal Swab  Result Value Ref Range Status   SARS Coronavirus 2 NEGATIVE NEGATIVE Final    Comment: (NOTE) If result is NEGATIVE SARS-CoV-2 target nucleic acids are NOT DETECTED. The SARS-CoV-2 RNA is generally detectable in upper and lower  respiratory specimens during the acute phase of infection. The lowest  concentration of SARS-CoV-2 viral copies this assay can detect is 250  copies / mL. A negative result does not preclude SARS-CoV-2 infection  and should not be used as the sole basis for treatment or other  patient management decisions.  A negative result may occur with  improper specimen collection / handling, submission of specimen other  than nasopharyngeal swab, presence of viral mutation(s) within the  areas targeted by this assay, and inadequate number of viral copies  (<250 copies / mL). A negative result must be combined with clinical  observations, patient history, and epidemiological  information. If result is POSITIVE SARS-CoV-2 target nucleic acids are DETECTED. The SARS-CoV-2 RNA is generally detectable in upper and lower  respiratory specimens dur ing the acute phase of infection.  Positive  results are indicative of active infection with SARS-CoV-2.  Clinical  correlation with patient history and other diagnostic information is  necessary to determine patient infection status.  Positive results do  not rule out bacterial infection or co-infection with other viruses. If result is PRESUMPTIVE POSTIVE SARS-CoV-2 nucleic acids MAY BE PRESENT.   A presumptive positive result was obtained on the submitted specimen  and confirmed on repeat testing.  While 2019 novel coronavirus  (SARS-CoV-2) nucleic acids may be present in the submitted sample  additional confirmatory testing may be necessary for epidemiological  and / or clinical management purposes  to differentiate between  SARS-CoV-2 and other Sarbecovirus currently known to infect humans.  If clinically indicated additional testing with an alternate test  methodology [phone]) is advised. The SARS-CoV-2 RNA is generally  detectable in upper and lower respiratory sp ecimens during the acute  phase of infection. The expected result is Negative. Fact Sheet for Patients:  StrictlyIdeas.no Fact Sheet for Healthcare Providers: BankingDealers.co.za This test is not yet approved or cleared by the [place] FDA and has been authorized for detection and/or diagnosis of  SARS-CoV-2 by FDA under an Emergency Use Authorization (EUA).  This EUA will remain in effect (meaning this test can be used) for the duration of the COVID-19 declaration under Section 564(b)(1) of the Act, 21 U.S.C. section 360bbb-3(b)(1), unless the authorization is terminated or revoked sooner. Performed at [facility], [address] [place], [place] [address]     Coagulation  Studies: No results for input(s): LABPROT, INR in the last 72 hours.  Urinalysis:  Recent Labs  Lab 09/13/18 1122  COLORURINE YELLOW*  LABSPEC 1.012  PHURINE 6.0  GLUCOSEU NEGATIVE  HGBUR NEGATIVE  BILIRUBINUR NEGATIVE  KETONESUR NEGATIVE  PROTEINUR NEGATIVE  NITRITE NEGATIVE  LEUKOCYTESUR NEGATIVE    Lipid Panel:  No results found for: CHOL, TRIG, HDL, CHOLHDL, VLDL, LDLCALC  HgbA1C:  Lab Results  Component Value Date   HGBA1C 5.6 09/13/2018    Urine Drug Screen:      Component Value Date/Time   LABOPIA NONE DETECTED 09/13/2018 1122   LABOPIA NONE DETECTED 03/01/2017 1635   COCAINSCRNUR NONE DETECTED 09/13/2018 1122   LABBENZ POSITIVE (A) 09/13/2018 1122   LABBENZ NONE DETECTED 03/01/2017 1635   AMPHETMU NONE DETECTED 09/13/2018 1122   AMPHETMU NONE DETECTED 03/01/2017 1635   THCU POSITIVE (A) 09/13/2018 1122   THCU POSITIVE (A) 03/01/2017 1635   LABBARB NONE DETECTED 09/13/2018 1122   LABBARB NONE DETECTED 03/01/2017 1635    Alcohol Level:  Recent Labs  Lab 09/12/18 2356  ETH <10    Other results: EKG: normal EKG, normal sinus rhythm, unchanged from previous tracings.  Imaging: Ct Head Wo Contrast  Result Date: 09/13/2018 CLINICAL DATA:  Seizure, new, nontraumatic EXAM: CT HEAD WITHOUT CONTRAST TECHNIQUE: Contiguous axial images were obtained from the base of the skull through the vertex without intravenous contrast. COMPARISON:  03/01/2017 FINDINGS: Brain: No evidence of infarction, hemorrhage, hydrocephalus, extra-axial collection or mass lesion/mass effect. Vascular: No hyperdense vessel or unexpected calcification. Skull: Normal. Negative for fracture or focal lesion. Sinuses/Orbits: Negative IMPRESSION: Negative head CT. Electronically Signed   By: Marnee SpringJonathon  Watts M.D.   On: 09/13/2018 06:00   Dg Hand Complete Left  Result Date: 09/14/2018 CLINICAL DATA:  LEFT hand pain and swelling after being placed in handcuffs. EXAM: LEFT HAND - COMPLETE 3+ VIEW  COMPARISON:  None. FINDINGS: Osseous alignment is normal. No fracture line or displaced fracture fragment seen. No degenerative change. Soft tissues about the LEFT hand are unremarkable. IMPRESSION: Negative. Electronically Signed   By: Bary RichardStan  Maynard M.D.   On: 09/14/2018 04:49     Assessment/Plan:  53 y.o. male of PTSD, paranoia, and bipolar d/o.  [facility] [place] and takes Prozac, Trazodone, and Prazosin (nightmares).  None of these relate to a bipolar d/o.  His paranoia, nightmares, and anxiety are related to PTSD.  He does have a seizure d/o and takes lamictal and suspected Keppra. Presented with agitation and aggression. Suspected seizure episode. He appears to not be compliant with his medications. Currentrly back to baseline.  EEG no seizure activity.   - No further seizures and normal EEG  - con't Keppra and lamictal which he appears no to be compliant with and states that he sometimes forgets it - No further imaging from neuro stand point.   09/14/2018, 10:41 AM

## 2018-09-14 NOTE — Plan of Care (Signed)
Pt has been very calm and compliant today.  He doesn't understand why he continues to be here under IVC.  He hasn't threatened to harm himself or others.  We are not doing anything medically for him except giving him his regular medications.  Nothing new has been ordered.  Patient's main concern is about his cat and dog.  Have communicated several times with his sister, Alan Mulder at [phone].  She states she will go over to his house with [employer]/ police.  She is afraid of pt's exwife.  Have asked the Psychiatric NP to come talk to him and she agreed she would.  Have also discussed this case with the social worker and she's talked with patient and her note is posted.

## 2018-09-15 MED ORDER — LEVETIRACETAM 500 MG PO TABS: 500.0000 mg | ORAL_TABLET | Freq: Two times a day (BID) | ORAL | 0 refills | Status: AC

## 2018-09-15 NOTE — BH Assessment (Signed)
Per the request of Psych Nurse Practitioner Waylan Boga), writer contacted patient's sister ([phone]) and confirmed the patient's gun has been removed from the home. Per her report, [employer] have the gun. Writer updated Psych Nurse Practitioner.

## 2018-09-15 NOTE — Progress Notes (Signed)
Received MD order to discharge patient to home, verified homes meds, discharged instructions and follow up appointments with patient and patient verbalized understanding

## 2018-09-15 NOTE — Consult Note (Signed)
[facility] Face-to[facility]Face [facility]iat[facility] Consult   Reason for Consult:  Mental health d/o Referring Physician:  Dr Elpidio AnisSudini Patient Identification: Hunter Green MRN:  [id number] Principal Diagnosis: Seizures Diagnosis:  Active Problems:   PTSD (post-traumatic stress disorder)   Seizures (HCC)  Total Time spent with patient: 30 minutes  Subjective:  Patient was psychiatrically cleared yesterday but needed to have his gun secured by someone prior to discharge due to his threats on admission.  Seen and evaluated in person to see if the gun was secured.  His sister attests to TTS per phone that the [place] police did secure his gun.  He remains calm and cooperative with no suicidal/homicidal ideations, hallucinations, or mania noted.  History of PTSD, [facility] [place].  Patient pleasant and cooperative, this provider apologized for his delay of discharge and thanked him for his patience and cooperation.  09/14/18 Hunter Green is a 53 y.o. male patient admitted with seizure activity.  "I want to go home.  I am ready.:" Patient was IVC'd in the ED after threatening police, gun noted in documentation.  Denies current suicidal/homicidal ideations, hallucinations, and substance abuse.  The psychiatric medical director, Dr Lucianne MussKumar, requests the client have someone he trusts to secure his gun for a few days for safety.  Patient adamantly refused this and stated he did not have a criminal record and did not want the person living in his house to have his gun (even though this was not mentioned) as they do have a criminal record.  He reported yesterday that he was living with his exwife.  The patient is mentally stable at this time but based on what he said (noted below), the request is made.  This is a recommendation from psychiatry.  HPI:  53 yo male who reports a history of PTSD, paranoia, and bipolar d/o.  [facility] [place] and takes Prozac, Trazodone, and Prazosin (nightmares).  None of  these relate to a bipolar d/o.  His paranoia, nightmares, and anxiety are related to PTSD.  He does have a seizure d/o and takes Keppra which could be provide some mood stabilization despite not being a typical mood stabilizer.  No mania noted on assessment.  Calm and cooperative.  He does complain of it being "stressful" at home because he is still living with his exwife.  Denies suicidal/homicidal ideations, hallucinations.  He does use marijuana at times and admitted to the MD that he used prior to admission, positive on UDS.  Psychiatrically stable at this time and [facility].  Per admission to the ED:  53 y.o. male presents to the emergency department in police custody secondary to "manic".  Police states that they were notified by the patient's wife that he was having bizarre behavior on their arrival patient states "I am [name] and I am going to F... Y'all up".  Patient presents to the emergency department diaphoretic handcuffed.  Patient denied any complaints on my initial evaluation.  Police states that the patient's wife states that he has a psychiatric history weapons in the home.  I was also notified by the police that his wife stated that he has a history of seizure disorder    Past Psychiatric History:  PTSD, anxiety  Risk to Self:  none Risk to Others:  none [facility] [place]  Past Medical History:  Past Medical History:  Diagnosis Date  . Seizures (HCC)  History reviewed. No pertinent surgical history. Family History: History reviewed. No pertinent family history. Family Psychiatric  History: none Social History:  Social History   Substance and Sexual Activity  Alcohol Use Yes     Social History   Substance and Sexual Activity  Drug Use Yes  . Types: Marijuana    Social History   Socioeconomic History  . Marital status: Single    Spouse name: Not on file  . Number of children: Not on  file  . Years of education: Not on file  . Highest education level: Not on file  Occupational History  . Not on file  Social Needs  . Financial resource strain: Not on file  . Food insecurity    Worry: Not on file    Inability: Not on file  . Transportation needs    Medical: Not on file    Non-medical: Not on file  Tobacco Use  . Smoking status: Former Games developermoker  . Smokeless tobacco: Never Used  Substance and Sexual Activity  . Alcohol use: Yes  . Drug use: Yes    Types: Marijuana  . Sexual activity: Not on file  Lifestyle  . Physical activity    Days per week: Not on file    Minutes per session: Not on file  . Stress: Not on file  Relationships  . Social Musicianconnections    Talks on phone: Not on file    Gets together: Not on file    Attends religious service: Not on file    Active member of club or organization: Not on file    Attends meetings of clubs or organizations: Not on file    Relationship status: Not on file  Other Topics Concern  . Not on file  Social History Narrative  . Not on file   Additional Social History:    Allergies:  No Known Allergies  Labs:  Results for orders placed or performed during the hospital encounter of 09/12/18 (from the past 48 hour(s))  Glucose, capillary     Status: Abnormal   Collection Time: 09/13/18  9:12 PM  Result Value Ref Range   Glucose-Capillary 107 (H) 70 - 99 mg/dL  CBC with Differential/Platelet     Status: Abnormal   Collection Time: 09/14/18  3:55 AM  Result Value Ref Range   WBC 7.8 4.0 - 10.5 K/uL   RBC 4.65 4.22 - 5.81 MIL/uL   Hemoglobin 13.8 13.0 - 17.0 g/dL   HCT 40.940.3 81.139.0 - 91.452.0 %   MCV 86.7 80.0 - 100.0 fL   MCH 29.7 26.0 - 34.0 pg   MCHC 34.2 30.0 - 36.0 g/dL   RDW 78.212.6 95.611.5 - 21.315.5 %   Platelets 148 (L) 150 - 400 K/uL   nRBC 0.0 0.0 - 0.2 %   Neutrophils Relative % 62 %   Neutro Abs 4.8 1.7 - 7.7 K/uL   Lymphocytes Relative 30 %   Lymphs Abs 2.4 0.7 - 4.0 K/uL   Monocytes Relative 7 %   Monocytes  Absolute 0.5 0.1 - 1.0 K/uL   Eosinophils Relative 1 %   Eosinophils Absolute 0.1 0.0 - 0.5 K/uL   Basophils Relative 0 %   Basophils Absolute 0.0 0.0 - 0.1 K/uL   Immature Granulocytes 0 %   Abs Immature Granulocytes 0.02 0.00 - 0.07 K/uL    Comment: Performed at [facility], [address] [place], [place] [address]  Basic metabolic panel     Status: Abnormal   Collection  Time: 09/14/18  3:55 AM  Result Value Ref Range   Sodium 138 135 - 145 mmol/L   Potassium 3.5 3.5 - 5.1 mmol/L   Chloride 105 98 - 111 mmol/L   CO2 25 22 - 32 mmol/L   Glucose, Bld 93 70 - 99 mg/dL   BUN 13 6 - 20 mg/dL   Creatinine, Ser 1.22 0.61 - 1.24 mg/dL   Calcium 8.8 (L) 8.9 - 10.3 mg/dL   GFR calc non Af Amer >60 >60 mL/min   GFR calc Af Amer >60 >60 mL/min   Anion gap 8 5 - 15    Comment: Performed at [facility], [address] [place], [place] [address]  Glucose, capillary     Status: Abnormal   Collection Time: 09/14/18  8:35 PM  Result Value Ref Range   Glucose-Capillary 100 (H) 70 - 99 mg/dL    Current Facility-Administered Medications  Medication Dose Route Frequency Provider Last Rate Last Dose  . 0.9 %  sodium chloride infusion  75 mL/hr Intravenous Continuous Mansy, Jan A, MD 75 mL/hr at 09/15/18 1022 75 mL/hr at 09/15/18 1022  . acetaminophen (TYLENOL) tablet 650 mg  650 mg Oral Q4H PRN Mansy, Jan A, MD       Or  . acetaminophen (TYLENOL) suppository 650 mg  650 mg Rectal Q4H PRN Mansy, Jan A, MD      . enoxaparin (LOVENOX) injection 40 mg  40 mg Subcutaneous Q24H Mansy, Jan A, MD   40 mg at 09/15/18 9485  . FLUoxetine (PROZAC) capsule 60 mg  60 mg Oral QHS Patrecia Pour, NP   60 mg at 09/14/18 2056  . ibuprofen (ADVIL) tablet 400 mg  400 mg Oral Q4H PRN Mansy, Jan A, MD   400 mg at 09/13/18 2338  . lamoTRIgine (LAMICTAL) tablet 100 mg  100 mg Oral q morning - 10a Ojie, Jude, MD   100 mg at 09/15/18 0851  . levETIRAcetam (KEPPRA) IVPB 500 mg/100 mL premix  500 mg  Intravenous Q12H Hillary Bow, MD 400 mL/hr at 09/15/18 0347 500 mg at 09/15/18 0347  . magnesium hydroxide (MILK OF MAGNESIA) suspension 30 mL  30 mL Oral Daily PRN Mansy, Jan A, MD      . ondansetron [facility]) tablet 4 mg  4 mg Oral Q6H PRN Mansy, Jan A, MD       Or  . ondansetron [facility]) injection 4 mg  4 mg Intravenous Q6H PRN Mansy, Jan A, MD      . prazosin (MINIPRESS) capsule 1 mg  1 mg Oral QHS Patrecia Pour, NP   1 mg at 09/14/18 2056  . sodium chloride flush (NS) 0.9 % injection 3 mL  3 mL Intravenous Q12H Mansy, Jan A, MD   3 mL at 09/14/18 2128  . sodium chloride flush (NS) 0.9 % injection 3 mL  3 mL Intravenous PRN Mansy, Jan A, MD      . traZODone (DESYREL) tablet 100 mg  100 mg Oral QHS Patrecia Pour, NP   100 mg at 09/14/18 2057  . ziprasidone (GEODON) injection 10 mg  10 mg Intramuscular Q6H PRN Mansy, Arvella Merles, MD        Musculoskeletal: Strength & Muscle Tone: within normal limits Gait & Station: did not witness Patient leans: N/A  Psychiatric Specialty Exam: Physical Exam  Nursing note and vitals reviewed. Constitutional: He is oriented to person, place, and time. He appears well-developed and well-nourished.  HENT:  Head: Normocephalic.  Neck: Normal range of motion.  Respiratory: Effort normal.  Musculoskeletal: Normal range of motion.  Neurological: He is alert and oriented to person, place, and time.  Psychiatric: His speech is normal and behavior is normal. Judgment and thought content normal. His mood appears anxious. Cognition and memory are normal.    Review of Systems  Psychiatric/Behavioral: Positive for substance abuse. The patient is nervous/anxious.   All other systems reviewed and are negative.   Blood pressure 108/75, pulse 70, temperature 98.6 F (37 C), temperature source Oral, resp. rate 19, height 6\' 1"  (1.854 m), weight 104 kg, SpO2 97 %.Body mass index is 30.25 kg/m.  General Appearance: Casual  Eye Contact:  Good  Speech:  Normal Rate   Volume:  Normal  Mood:  Anxious, mild  Affect:  Congruent  Thought Process:  Coherent and Descriptions of Associations: Intact  Orientation:  Full (Time, Place, and Person)  Thought Content:  WDL and Logical  Suicidal Thoughts:  No  Homicidal Thoughts:  No  Memory:  Immediate;   Good Recent;   Good Remote;   Good  Judgement:  Fair  Insight:  Fair  Psychomotor Activity:  Decreased  Concentration:  Concentration: Good and Attention Span: Good  Recall:  Good  Fund of Knowledge:  Good  Language:  Good  Akathisia:  No  Handed:  Right  AIMS (if indicated):     Assets:  Housing Leisure Time Physical Health Resilience Social Support  ADL's:  Intact  Cognition:  WNL  Sleep:        Treatment Plan Summary: PTSD: -Continued Prozac 60 mg daily -Gun [place] police due to threats on admission to the ED -IVC resended  Insomnia: -Continueded Trazodone 100 mg at bedtime  Nightmares: -Continued Prazosin 1 mg at bedtime  Disposition: No evidence of imminent risk to self or others at present.   Patient does not meet criteria for psychiatric inpatient admission. Discussed crisis plan, support from social network, calling 911, coming to the Emergency Department, and calling Suicide Hotline.  Nanine MeansJamison Malaysha Arlen, NP 09/15/2018 12:26 PM

## 2018-09-15 NOTE — Discharge Summary (Signed)
[employer] [facility] [place] at [facility]   PATIENT NAME: Hunter Green    MR#:  [id number]  DATE OF BIRTH:  Jan 03, 1966  DATE OF ADMISSION:  09/12/2018   ADMITTING PHYSICIAN: Hannah BeatJan A Mansy, MD  DATE OF DISCHARGE: 09/15/2018  PRIMARY CARE PHYSICIAN: [facility]   ADMISSION DIAGNOSIS:  Seizure (HCC) [R56.9] Acute psychosis (HCC) [F23] DISCHARGE DIAGNOSIS:  Active Problems:   Seizures (HCC)   PTSD (post-traumatic stress disorder)  SECONDARY DIAGNOSIS:   Past Medical History:  Diagnosis Date  . Seizures [facility](HCC)    HOSPITAL COURSE:  Chief complaint; mental health problem and seizures  History of presenting complaint; Hunter Green  is a 53 y.o. African-American male with a known history of bipolar and seizure disorder, who presented to the emergency room with the onset of seizures in the emergency room preceded by delusions of being "[name]" which were witnessed by his wife who called the police.  The patient's wife stated that he has a psychiatric history and has had weapons in the house.  The patient was postictal after his seizures.  He was given 20 g of IM Ativan in the ER followed by 1 mg of IV Ativan for continued seizure-like activity.  He was then given 1 g of IV Keppra then his seizure activity resolved.  He was involuntarily committed and admitted to medical service.  Hospital course; 1. Breakthrough seizures with history of seizure disorder.  Patient seen by neurologist.  Appreciate input.  EEG was normal.  No further seizures.    Patient noted to be on Lamictal prior to admission.  Keppra was added during this admission. Recommendation is to continue Keppra and Lamictal.  No further imaging from neuro standpoint.  2. Hypokalemia.  Replaced   3. Acute kidney injury.  Renal function improved with IV fluids  4. Hyperglycemia with high anion gap and acidosis Resolved  Glycosylated hemoglobin level of 5.6  5. Bipolar disorder with associated  delusions and acute psychosis. Patient seen by psychiatrist.  Patient had IVC initiated on admission.  Psych was following patient throughout his admission.  Patient was reported to have made threats to hurt the police.  I was notified by Thayer OhmLord Jimison this morning that patient was going has been secured by the police and psychiatric service has rescinded the IVC.  Patient cleared for discharge from psychiatric standpoint.  Patient denies any suicidal or homicidal ideations.  Clinically and medically stable for discharge.  6. Leukocytosis.  Resolved.This could be stress demargination due to seizures.    DISCHARGE CONDITIONS:  Stable CONSULTS OBTAINED:  Treatment Team:  [facility], Neuro1, MD DRUG ALLERGIES:  No Known Allergies DISCHARGE MEDICATIONS:   Allergies as of 09/15/2018   No Known Allergies     Medication List    TAKE these medications   FLUoxetine 20 MG capsule Commonly known as: PROZAC Take 60 mg by mouth at bedtime.   LAMOTRIGINE PO Take by mouth daily.   levETIRAcetam 500 MG tablet Commonly known as: Keppra Take 1 tablet (500 mg total) by mouth 2 (two) times daily.   prazosin 2 MG capsule Commonly known as: MINIPRESS Take 2 mg by mouth at bedtime.   traZODone 100 MG tablet Commonly known as: DESYREL Take 100 mg by mouth at bedtime.        DISCHARGE INSTRUCTIONS:   DIET:  Cardiac diet DISCHARGE CONDITION:  Stable ACTIVITY:  Activity as tolerated OXYGEN:  Home Oxygen: No.  Oxygen Delivery: room air DISCHARGE LOCATION:  home  If you experience worsening of your admission symptoms, develop shortness of breath, life threatening emergency, suicidal or homicidal thoughts you must seek medical attention immediately by calling 911 or calling your MD immediately  if symptoms less severe.  You Must read complete instructions/literature along with all the possible adverse reactions/side effects for all the Medicines you take and that have been  prescribed to you. Take any new Medicines after you have completely understood and accpet all the possible adverse reactions/side effects.   Please note  You were cared for by a hospitalist during your hospital stay. If you have any questions about your discharge medications or the care you received while you were in the hospital after you are discharged, you can call the unit and asked to speak with the hospitalist on call if the hospitalist that took care of you is not available. Once you are discharged, your primary care physician will handle any further medical issues. Please note that NO REFILLS for any discharge medications will be authorized once you are discharged, as it is imperative that you return to your primary care physician (or establish a relationship with a primary care physician if you do not have one) for your aftercare needs so that they can reassess your need for medications and monitor your lab values.    On the day of Discharge:  VITAL SIGNS:  Blood pressure 108/75, pulse 70, temperature 98.6 F (37 C), temperature source Oral, resp. rate 19, height 6\' 1"  (1.854 m), weight 104 kg, SpO2 97 %. PHYSICAL EXAMINATION:  GENERAL:  53 y.o.-year-old patient lying in the bed with no acute distress.  EYES: Pupils equal, round, reactive to light and accommodation. No scleral icterus. Extraocular muscles intact.  HEENT: Head atraumatic, normocephalic. Oropharynx and nasopharynx clear.  NECK:  Supple, no jugular venous distention. No thyroid enlargement, no tenderness.  LUNGS: Normal breath sounds bilaterally, no wheezing, rales,rhonchi or crepitation. No use of accessory muscles of respiration.  CARDIOVASCULAR: S1, S2 normal. No murmurs, rubs, or gallops.  ABDOMEN: Soft, non-tender, non-distended. Bowel sounds present. No organomegaly or mass.  EXTREMITIES: No pedal edema, cyanosis, or clubbing.  NEUROLOGIC: Cranial nerves II through XII are intact. Muscle strength 5/5 in all  extremities. Sensation intact. Gait not checked.  PSYCHIATRIC: The patient is alert and oriented x 3.  SKIN: No obvious rash, lesion, or ulcer.  DATA REVIEW:   CBC Recent Labs  Lab 09/14/18 0355  WBC 7.8  HGB 13.8  HCT 40.3  PLT 148*    Chemistries  Recent Labs  Lab 09/12/18 2356  09/14/18 0355  NA 143   < > 138  K 3.0*   < > 3.5  CL 102   < > 105  CO2 14*   < > 25  GLUCOSE 210*   < > 93  BUN 15   < > 13  CREATININE 1.63*   < > 1.22  CALCIUM 10.1   < > 8.8*  MG 2.2  --   --   AST 33  --   --   ALT 34  --   --   ALKPHOS 98  --   --   BILITOT 0.6  --   --    < > = values in this interval not displayed.     Microbiology Results  Results for orders placed or performed during the hospital encounter of 09/12/18  SARS Coronavirus 2 (CEPHEID - Performed in [facility]), [facility] Order     Status: None  Collection Time: 09/13/18  3:34 AM   Specimen: Nasopharyngeal Swab  Result Value Ref Range Status   SARS Coronavirus 2 NEGATIVE NEGATIVE Final    Comment: (NOTE) If result is NEGATIVE SARS-CoV-2 target nucleic acids are NOT DETECTED. The SARS-CoV-2 RNA is generally detectable in upper and lower  respiratory specimens during the acute phase of infection. The lowest  concentration of SARS-CoV-2 viral copies this assay can detect is 250  copies / mL. A negative result does not preclude SARS-CoV-2 infection  and should not be used as the sole basis for treatment or other  patient management decisions.  A negative result may occur with  improper specimen collection / handling, submission of specimen other  than nasopharyngeal swab, presence of viral mutation(s) within the  areas targeted by this assay, and inadequate number of viral copies  (<250 copies / mL). A negative result must be combined with clinical  observations, patient history, and epidemiological information. If result is POSITIVE SARS-CoV-2 target nucleic acids are DETECTED. The SARS-CoV-2 RNA is  generally detectable in upper and lower  respiratory specimens dur ing the acute phase of infection.  Positive  results are indicative of active infection with SARS-CoV-2.  Clinical  correlation with patient history and other diagnostic information is  necessary to determine patient infection status.  Positive results do  not rule out bacterial infection or co-infection with other viruses. If result is PRESUMPTIVE POSTIVE SARS-CoV-2 nucleic acids MAY BE PRESENT.   A presumptive positive result was obtained on the submitted specimen  and confirmed on repeat testing.  While 2019 novel coronavirus  (SARS-CoV-2) nucleic acids may be present in the submitted sample  additional confirmatory testing may be necessary for epidemiological  and / or clinical management purposes  to differentiate between  SARS-CoV-2 and other Sarbecovirus currently known to infect humans.  If clinically indicated additional testing with an alternate test  methodology [phone]) is advised. The SARS-CoV-2 RNA is generally  detectable in upper and lower respiratory sp ecimens during the acute  phase of infection. The expected result is Negative. Fact Sheet for Patients:  StrictlyIdeas.no Fact Sheet for Healthcare Providers: BankingDealers.co.za This test is not yet approved or cleared by the [place] FDA and has been authorized for detection and/or diagnosis of SARS-CoV-2 by FDA under an Emergency Use Authorization (EUA).  This EUA will remain in effect (meaning this test can be used) for the duration of the COVID-19 declaration under Section 564(b)(1) of the Act, 21 U.S.C. section 360bbb-3(b)(1), unless the authorization is terminated or revoked sooner. Performed at [facility], [address] [place], [place] [address]     RADIOLOGY:  No results found.   Management plans discussed with the patient, family and they are in agreement.  CODE  STATUS: Full Code   TOTAL TIME TAKING CARE OF THIS PATIENT: 37 minutes.    Melchizedek Espinola M.D on 09/15/2018 at 1:03 PM  Between 7am to 6pm - Pager - [phone]  After 6pm go to [email or web address] - Proofreader  [employer] [facility] [place] [facility]  Office  [phone]  CC: Primary care physician; [facility]   Note: This dictation was prepared with Dragon dictation along with smaller phrase technology. Any transcriptional errors that result from this process are unintentional.

## 2018-09-15 NOTE — Progress Notes (Signed)
MEDICATION RELATED CONSULT NOTE - Follow up  Pharmacy Consult for drug-drug interactions  Indication: seizures and start of keppra   No Known Allergies  Patient Measurements:   Vital Signs: Temp: 98.6 F (37 C) (07/19 0837) Temp Source: Oral (07/19 0837) BP: 108/75 (07/19 0837) Pulse Rate: 70 (07/19 0837) Intake/Output from previous day: 07/18 0701 - 07/19 0700 In: 240 [P.O.:240] Out: -  Intake/Output from this shift: No intake/output data recorded.  Labs: Recent Labs    09/12/18 2356 09/13/18 0636 09/13/18 1200 09/14/18 0355  WBC 16.9* 9.9  --  7.8  HGB 15.9 14.3  --  13.8  HCT 48.0 41.4  --  40.3  PLT 205 155  --  148*  CREATININE 1.63* 1.27* 1.21 1.22  MG 2.2  --   --   --   ALBUMIN 5.3*  --   --   --   PROT 8.7*  --   --   --   AST 33  --   --   --   ALT 34  --   --   --   ALKPHOS 98  --   --   --   BILITOT 0.6  --   --   --    Estimated Creatinine Clearance: 89.7 mL/min (by C-G formula based on SCr of 1.22 mg/dL).   Assessment: Patient admitted for mental health issues and seizures w/ only PMH of seizures. Patient was started on levetiracetam 500 mg IV BID.  Goal of Therapy:  Avoidance of drug drug interactions w/ anti-epileptic medications.  Plan:  Patient may be at risk for some CNS depression due to concomitant use of levetiracetam, fluoxetine, lamotrigine, prazosin, trazodone, and prn ziprasidone (no dose of ziprasidone given yet). Fluoxetine, lamotrigine, prazosin and trazodone are home meds.   Will continue to monitor.   Rayna Sexton, PharmD, BCPS Clinical Pharmacist 09/15/2018 8:48 AM

## 2018-09-15 NOTE — BH Assessment (Signed)
Per RN Joelene Millin, the pt's sister has not called to notify staff that she has removed the gun from the pt's home.

## 2018-09-16 ENCOUNTER — Other Ambulatory Visit

## 2020-09-30 ENCOUNTER — Ambulatory Visit: Payer: Self-pay | Admitting: Physician Assistant

## 2020-09-30 ENCOUNTER — Other Ambulatory Visit: Payer: Self-pay

## 2020-09-30 ENCOUNTER — Encounter: Payer: Self-pay | Admitting: Physician Assistant

## 2020-09-30 DIAGNOSIS — Z113 Encounter for screening for infections with a predominantly sexual mode of transmission: Secondary | ICD-10-CM

## 2020-09-30 LAB — GRAM STAIN

## 2020-09-30 NOTE — Progress Notes (Signed)
   [facility] Department STI clinic/screening visit  Subjective:  Hunter Green is a 55 y.o. male being seen today for an STI screening visit. The patient reports they do not have symptoms.    Patient has the following medical conditions:   Patient Active Problem List   Diagnosis Date Noted   Seizures (HCC) 09/13/2018   PTSD (post-traumatic stress disorder) 09/13/2018     Chief Complaint  Patient presents with   SEXUALLY TRANSMITTED DISEASE    screening    HPI  Patient reports that he is not having any symptoms but would like a screening today.  Reports that he has a seizure disorder and PTSD for which he is in care at the [place].  Reports that he has had a vasectomy as his only surgery.  States last HIV test was 2 years ago and last void prior to sample collection for Gram stain was about 2 hr ago.   See flowsheet for further details and programmatic requirements.    The following portions of the patient's history were reviewed and updated as appropriate: allergies, current medications, past medical history, past social history, past surgical history and problem list.  Objective:  There were no vitals filed for this visit.  Physical Exam Constitutional:      General: He is not in acute distress.    Appearance: Normal appearance.  HENT:     Head: Normocephalic and atraumatic.     Comments: No nits,lice, or hair loss. No cervical, supraclavicular or axillary adenopathy.     Mouth/Throat:     Mouth: Mucous membranes are moist.     Pharynx: Oropharynx is clear. No oropharyngeal exudate or posterior oropharyngeal erythema.  Eyes:     Conjunctiva/sclera: Conjunctivae normal.  Pulmonary:     Effort: Pulmonary effort is normal.  Abdominal:     Palpations: Abdomen is soft. There is no mass.     Tenderness: There is no abdominal tenderness. There is no guarding or rebound.  Genitourinary:    Penis: Normal.      Testes: Normal.     Comments: Pubic area without  nits, lice, hair loss, edema, erythema, lesions and inguinal adenopathy. Penis circumcised without rash, lesions and discharge at meatus. Testicles descended bilaterally,nt, no masses or edema.  Musculoskeletal:     Cervical back: Neck supple. No tenderness.  Skin:    General: Skin is warm and dry.     Findings: No bruising, erythema, lesion or rash.  Neurological:     Mental Status: He is alert and oriented to person, place, and time.  Psychiatric:        Mood and Affect: Mood normal.        Behavior: Behavior normal.        Thought Content: Thought content normal.        Judgment: Judgment normal.      Assessment and Plan:  Hunter Green is a 55 y.o. male presenting to the [facility] Department for STI screening  1. Screening for STD (sexually transmitted disease) Patient into clinic without symptoms. Reviewed with patient that Gram stain is normal and no treatment is indicated today. Rec condoms with all sex. Await test results.  Counseled that RN will call if needs to RTC for treatment once results are back.  - Gram stain - Gonococcus culture - HIV [place] [facility] - Syphilis Serology, [facility]     No follow-ups on file.  No future appointments.  Matt Holmes, PA

## 2020-10-05 LAB — GONOCOCCUS CULTURE

## 2021-04-08 IMAGING — CT CT HEAD WITHOUT CONTRAST
3 series · 16 of 46 positions shown, 19 images · non-contrast
Comparison: 03/01/2017

CLINICAL DATA: Seizure, new, nontraumatic

EXAM:
CT HEAD WITHOUT CONTRAST
TECHNIQUE: Contiguous axial images were obtained from the base of the skull
through the vertex without intravenous contrast.

[Series 2: head wo · axial · 0.41mm/px · z∈[+541,+661]mm · 10 of 29 slices shown, 13 images]
[im 3/29  brain]
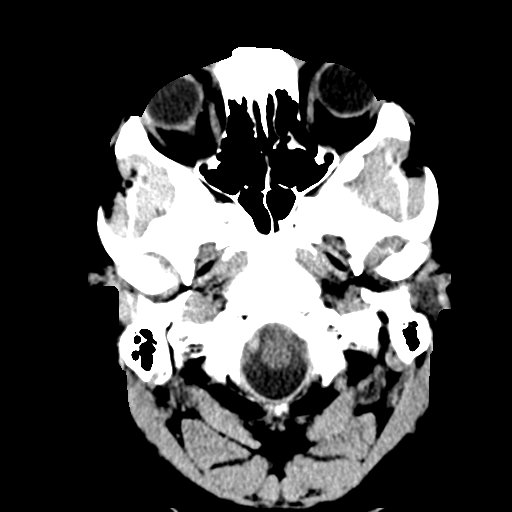
[im 3/29  bone]
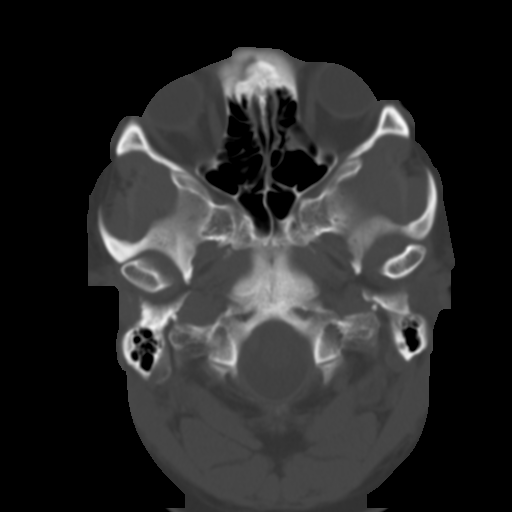
[im 6/29  brain]
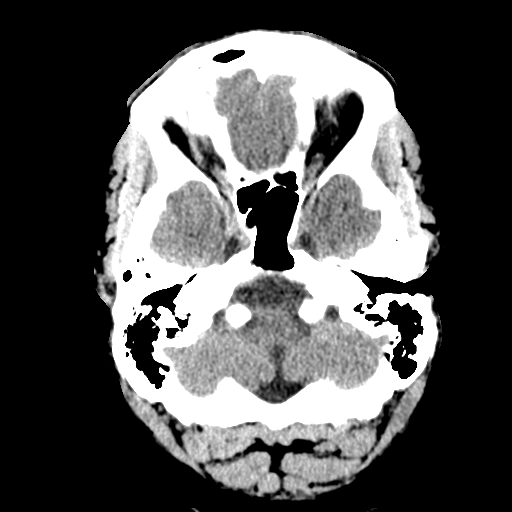
[im 8/29  brain]
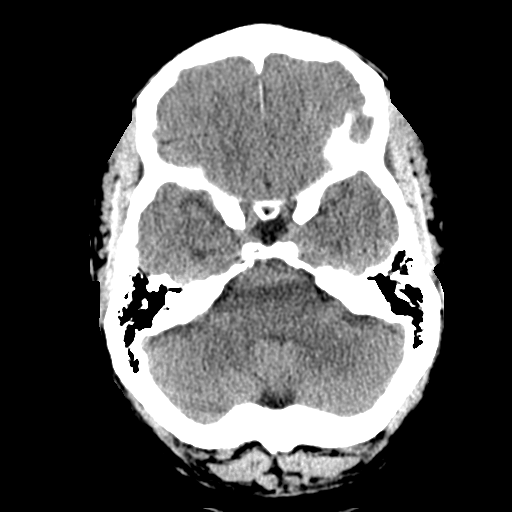
[im 11/29  brain]
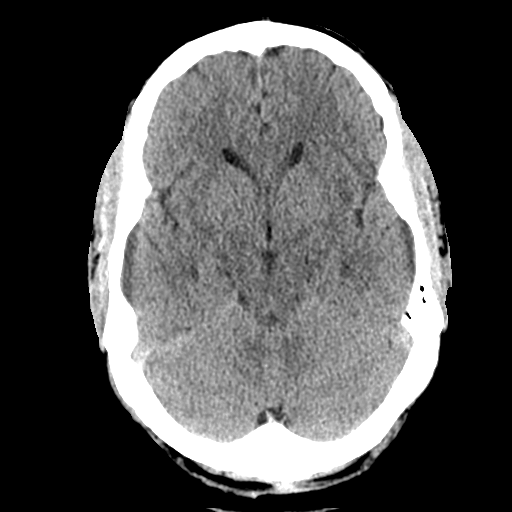
[im 14/29  brain]
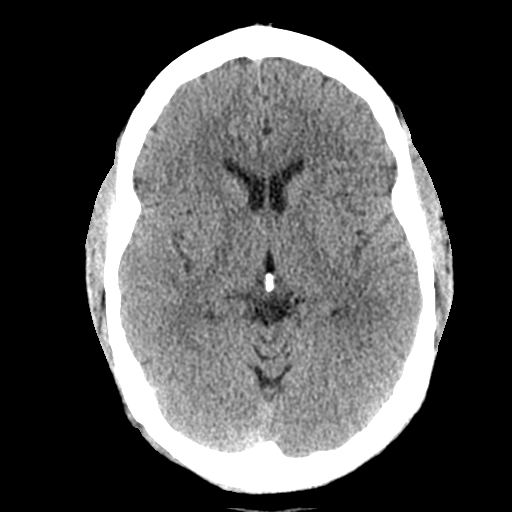
[im 14/29  bone]
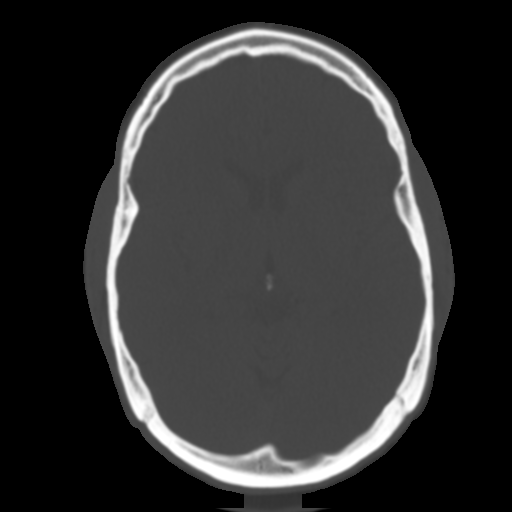
[im 16/29  brain]
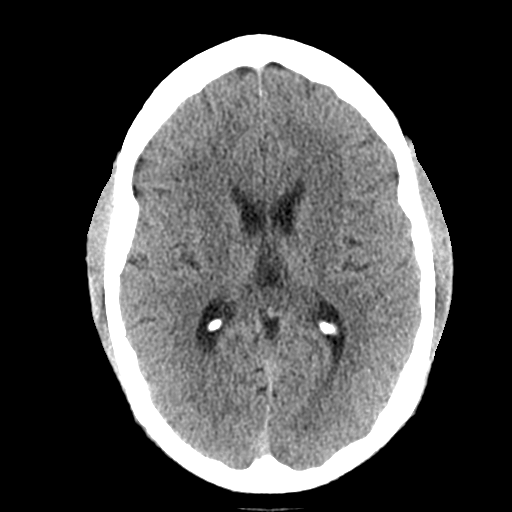
[im 19/29  brain]
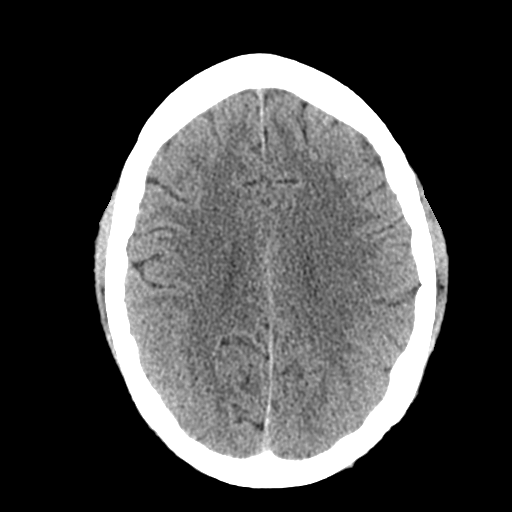
[im 22/29  brain]
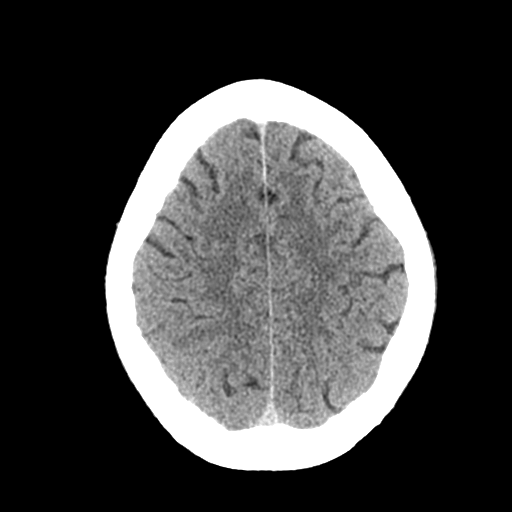
[im 24/29  brain]
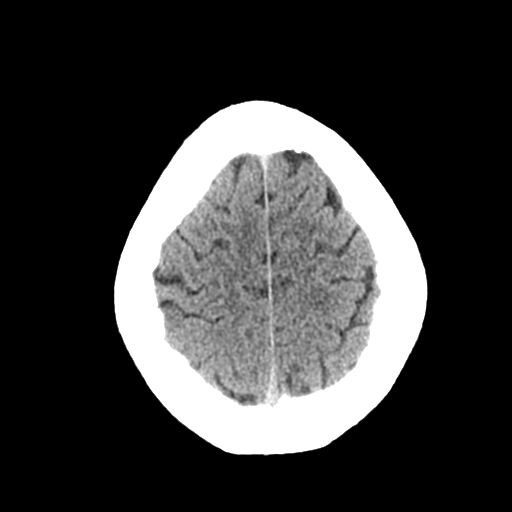
[im 24/29  bone]
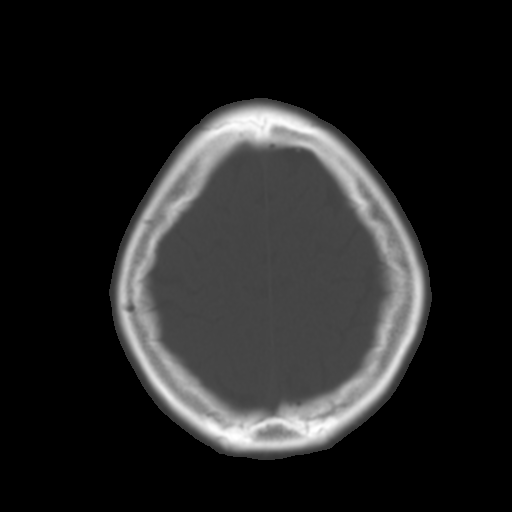
[im 27/29  brain]
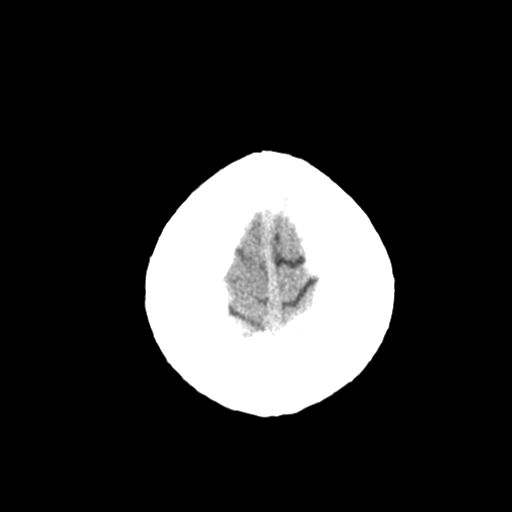

[Series 4: coronal soft tissue · coronal · 0.32mm/px · 3 of 66 slices shown]
[im 22/66  brain]
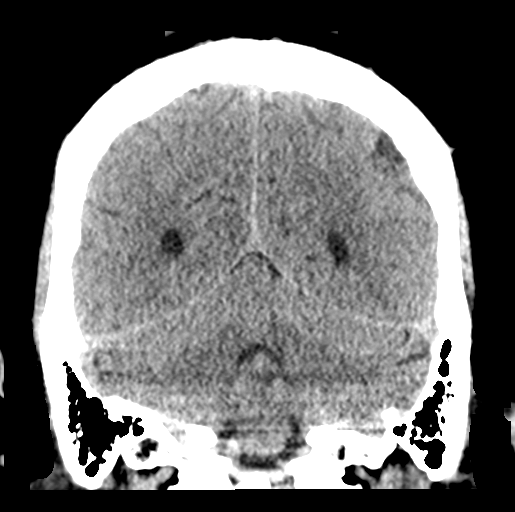
[im 29/66  brain]
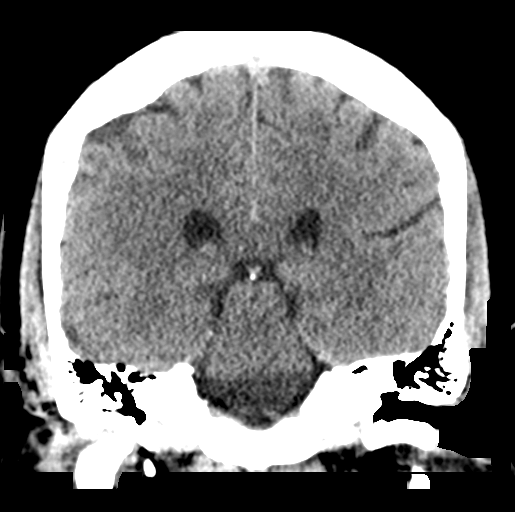
[im 37/66  brain]
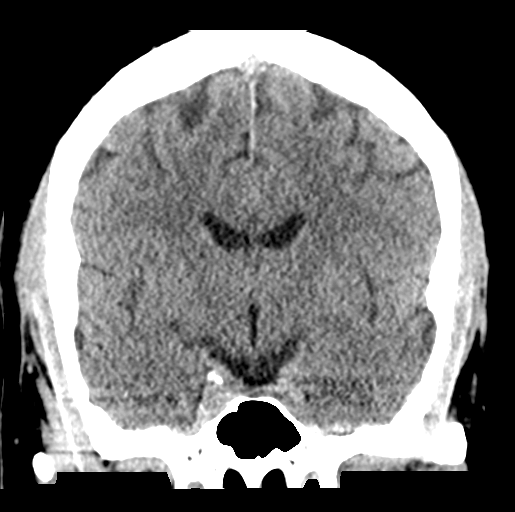

[Series 5: sagittal soft tissue · sagittal · 0.33mm/px · 3 of 49 slices shown]
[im 17/49  brain]
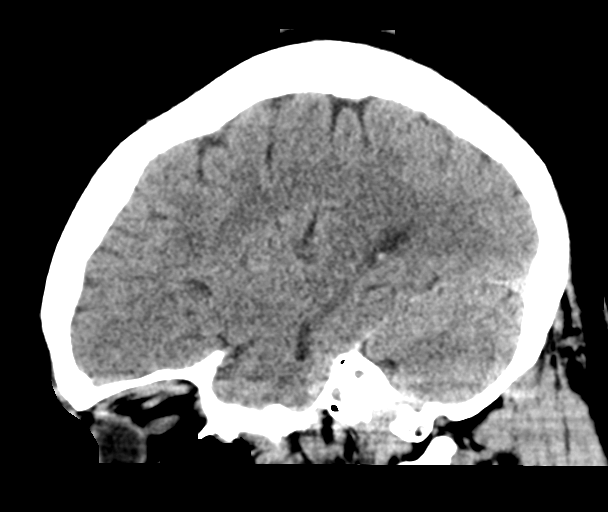
[im 25/49  brain]
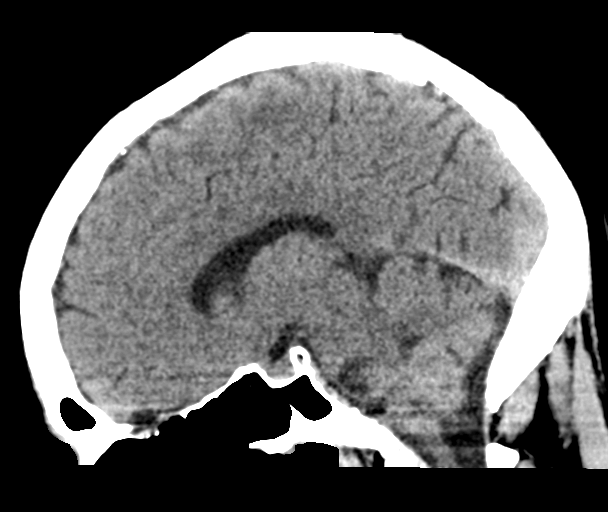
[im 33/49  brain]
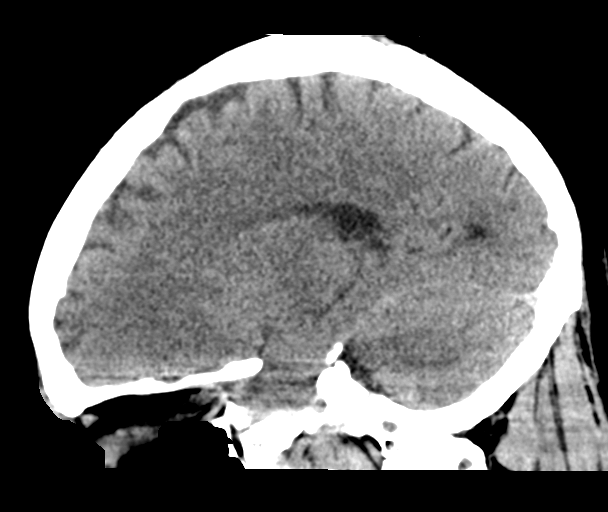

[16 of 46 positions shown; findings below may reference images not displayed]

FINDINGS: Brain: No evidence of infarction, hemorrhage, hydrocephalus,
extra-axial collection or mass lesion/mass effect.

Vascular: No hyperdense vessel or unexpected calcification.

Skull: Normal. Negative for fracture or focal lesion.

Sinuses/Orbits: Negative
IMPRESSION: Negative head CT.

## 2021-04-09 IMAGING — DX LEFT HAND - COMPLETE 3+ VIEW
3 series · 3 of 3 positions shown · non-contrast
Comparison: None.

CLINICAL DATA: LEFT hand pain and swelling after being placed in
handcuffs.

EXAM:
LEFT HAND - COMPLETE 3+ VIEW

[hand ap]
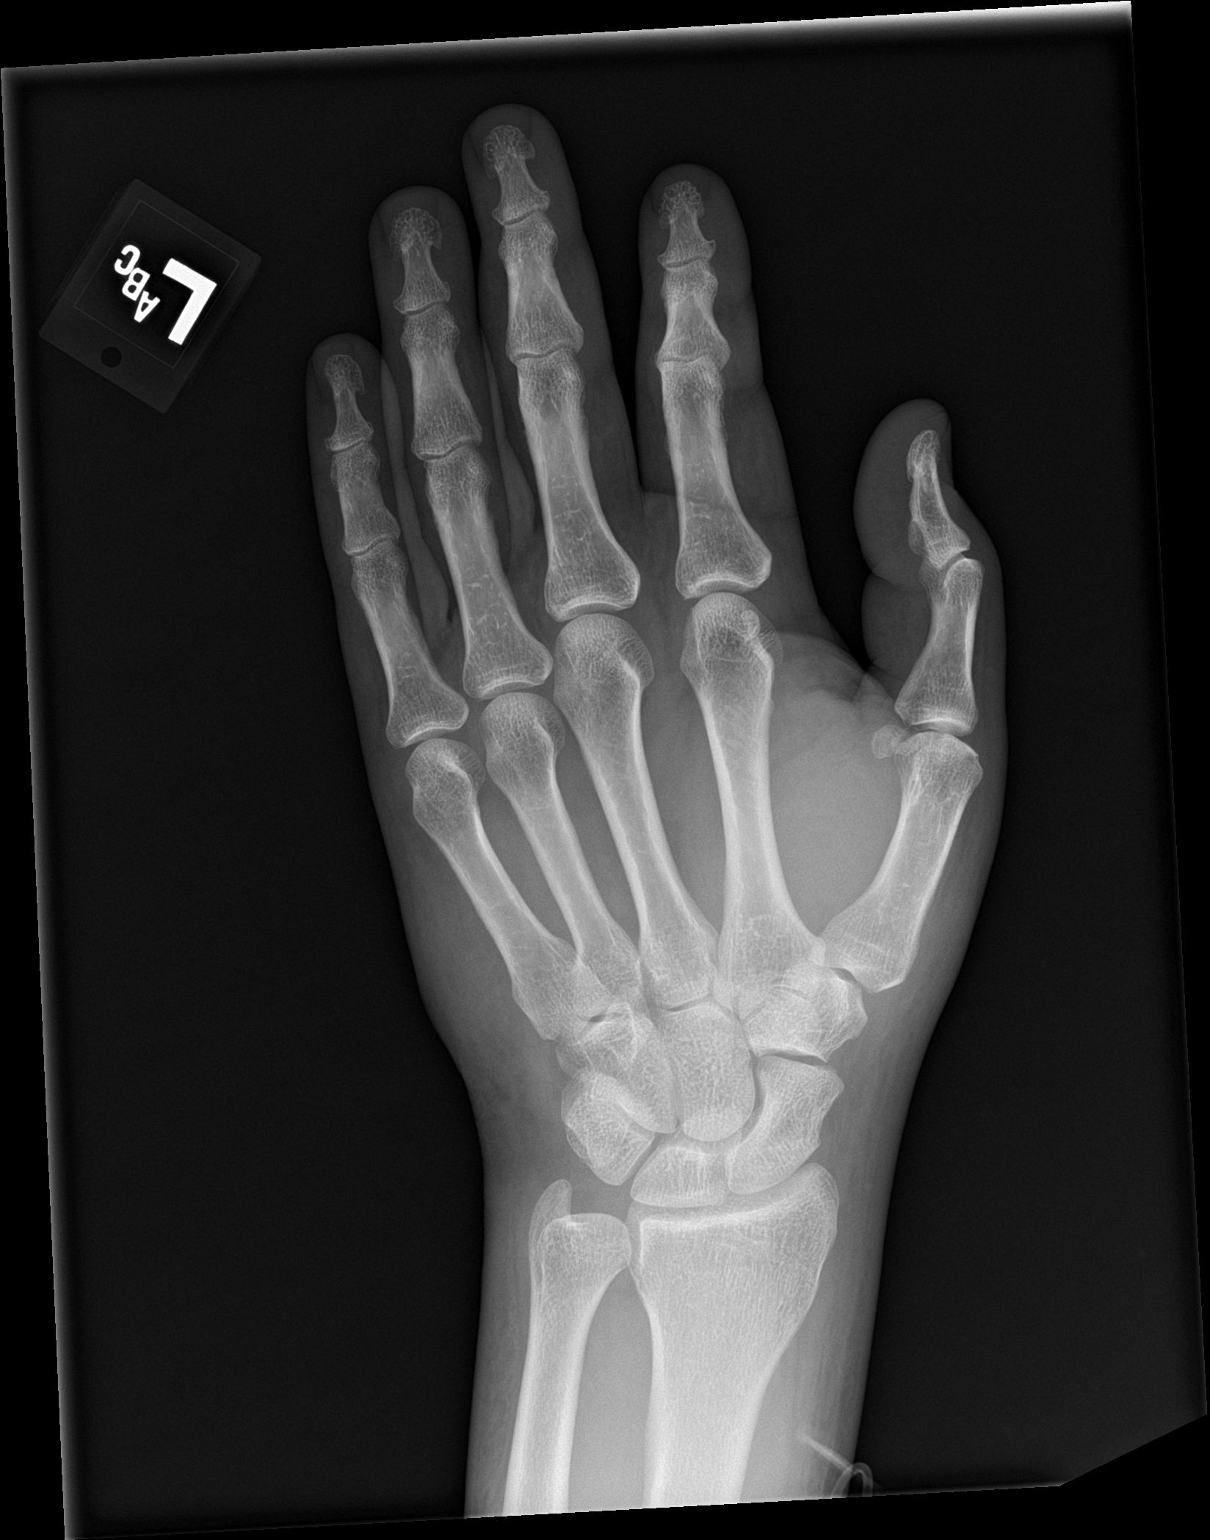

[hand obl]
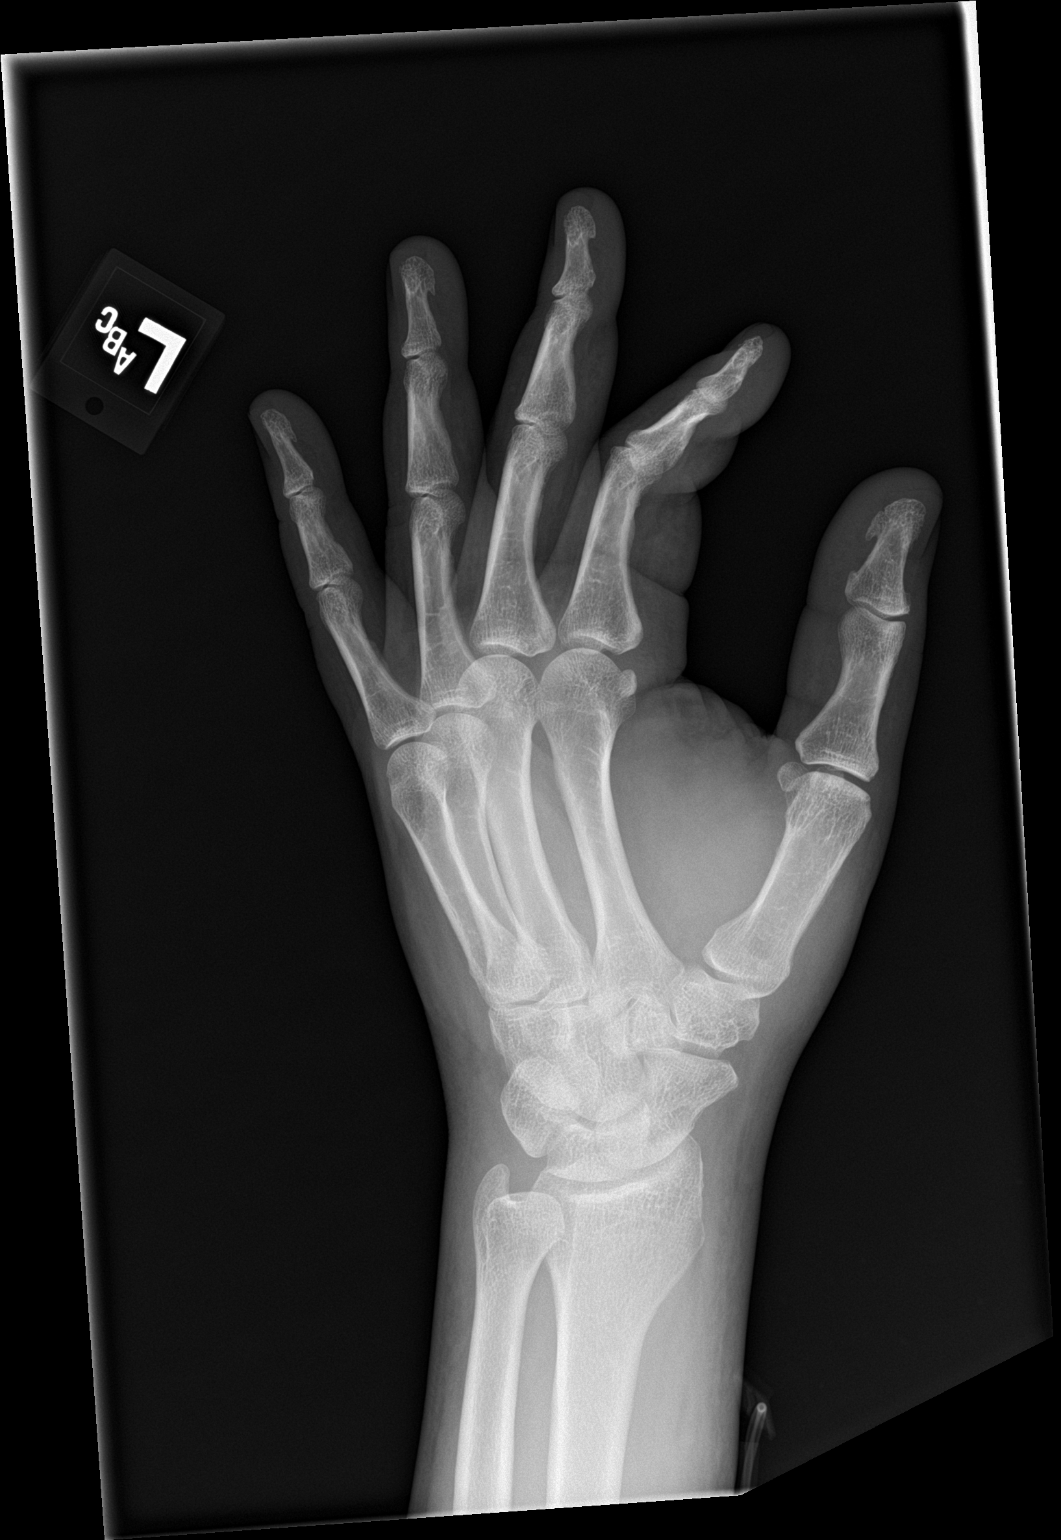

[hand lat]
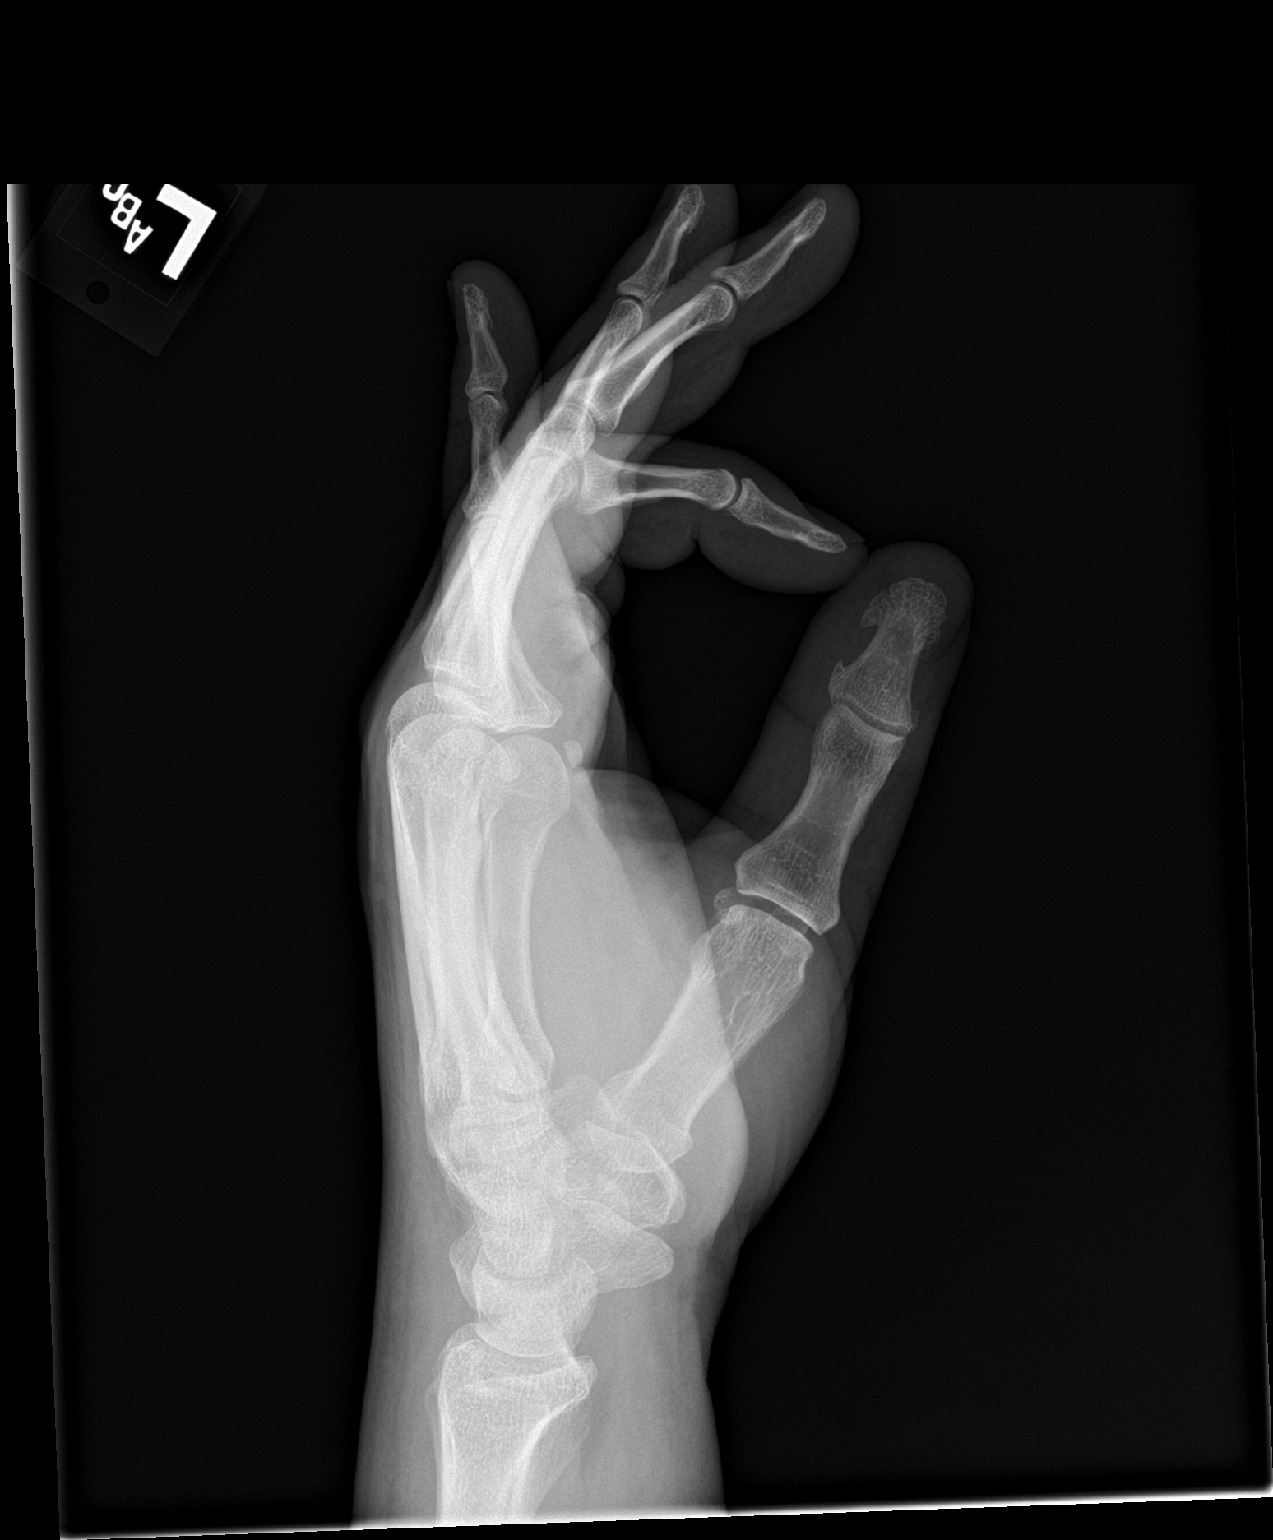

[3 of 3 positions shown; findings below may reference images not displayed]

FINDINGS: Osseous alignment is normal. No fracture line or displaced fracture
fragment seen. No degenerative change. Soft tissues about the LEFT
hand are unremarkable.
IMPRESSION: Negative.
